# Patient Record
Sex: Female | Born: 1935 | Race: White | Hispanic: No | Smoking: Current every day smoker
Health system: Southern US, Community
[De-identification: ages and names within clinical notes are randomized; demographics above are authoritative.]

## PROBLEM LIST (undated history)

## (undated) DIAGNOSIS — B269 Mumps without complication: Secondary | ICD-10-CM

## (undated) DIAGNOSIS — I10 Essential (primary) hypertension: Secondary | ICD-10-CM

## (undated) DIAGNOSIS — S3210XA Unspecified fracture of sacrum, initial encounter for closed fracture: Secondary | ICD-10-CM

## (undated) DIAGNOSIS — G809 Cerebral palsy, unspecified: Secondary | ICD-10-CM

## (undated) DIAGNOSIS — F17200 Nicotine dependence, unspecified, uncomplicated: Secondary | ICD-10-CM

## (undated) DIAGNOSIS — E079 Disorder of thyroid, unspecified: Secondary | ICD-10-CM

## (undated) DIAGNOSIS — T7840XA Allergy, unspecified, initial encounter: Secondary | ICD-10-CM

## (undated) DIAGNOSIS — R7303 Prediabetes: Secondary | ICD-10-CM

## (undated) DIAGNOSIS — E785 Hyperlipidemia, unspecified: Secondary | ICD-10-CM

## (undated) DIAGNOSIS — M25373 Other instability, unspecified ankle: Secondary | ICD-10-CM

## (undated) DIAGNOSIS — M419 Scoliosis, unspecified: Secondary | ICD-10-CM

## (undated) HISTORY — DX: Nicotine dependence, unspecified, uncomplicated: F17.200

## (undated) HISTORY — DX: Cerebral palsy, unspecified: G80.9

## (undated) HISTORY — DX: Other instability, unspecified ankle: M25.373

## (undated) HISTORY — DX: Scoliosis, unspecified: M41.9

## (undated) HISTORY — DX: Essential (primary) hypertension: I10

## (undated) HISTORY — PX: ABDOMINAL AORTIC ANEURYSM REPAIR: SHX42

## (undated) HISTORY — DX: Allergy, unspecified, initial encounter: T78.40XA

## (undated) HISTORY — DX: Hyperlipidemia, unspecified: E78.5

## (undated) HISTORY — DX: Prediabetes: R73.03

## (undated) HISTORY — DX: Mumps without complication: B26.9

## (undated) HISTORY — DX: Disorder of thyroid, unspecified: E07.9

## (undated) HISTORY — DX: Unspecified fracture of sacrum, initial encounter for closed fracture: S32.10XA

---

## 1998-06-03 ENCOUNTER — Emergency Department (HOSPITAL_COMMUNITY): Admission: EM | Admit: 1998-06-03 | Discharge: 1998-06-03 | Payer: Self-pay | Admitting: Emergency Medicine

## 1999-09-02 ENCOUNTER — Encounter: Payer: Self-pay | Admitting: Internal Medicine

## 1999-09-02 ENCOUNTER — Encounter: Admission: RE | Admit: 1999-09-02 | Discharge: 1999-09-02 | Payer: Self-pay | Admitting: Internal Medicine

## 2001-10-11 ENCOUNTER — Ambulatory Visit (HOSPITAL_BASED_OUTPATIENT_CLINIC_OR_DEPARTMENT_OTHER): Admission: RE | Admit: 2001-10-11 | Discharge: 2001-10-11 | Payer: Self-pay | Admitting: Otolaryngology

## 2001-10-11 ENCOUNTER — Encounter (INDEPENDENT_AMBULATORY_CARE_PROVIDER_SITE_OTHER): Payer: Self-pay | Admitting: Specialist

## 2002-06-07 ENCOUNTER — Encounter: Admission: RE | Admit: 2002-06-07 | Discharge: 2002-06-07 | Payer: Self-pay | Admitting: *Deleted

## 2007-06-09 DIAGNOSIS — S3210XA Unspecified fracture of sacrum, initial encounter for closed fracture: Secondary | ICD-10-CM

## 2007-06-09 HISTORY — PX: ILIAC ARTERY STENT: SHX1786

## 2007-06-09 HISTORY — DX: Unspecified fracture of sacrum, initial encounter for closed fracture: S32.10XA

## 2007-11-04 ENCOUNTER — Emergency Department (HOSPITAL_COMMUNITY): Admission: EM | Admit: 2007-11-04 | Discharge: 2007-11-04 | Payer: Self-pay | Admitting: Emergency Medicine

## 2007-12-14 ENCOUNTER — Encounter: Admission: RE | Admit: 2007-12-14 | Discharge: 2007-12-14 | Payer: Self-pay | Admitting: Orthopedic Surgery

## 2008-02-08 ENCOUNTER — Ambulatory Visit: Payer: Self-pay | Admitting: Vascular Surgery

## 2008-02-15 ENCOUNTER — Encounter: Admission: RE | Admit: 2008-02-15 | Discharge: 2008-02-15 | Payer: Self-pay | Admitting: Vascular Surgery

## 2008-02-22 ENCOUNTER — Ambulatory Visit: Payer: Self-pay | Admitting: Vascular Surgery

## 2008-03-02 ENCOUNTER — Ambulatory Visit: Payer: Self-pay | Admitting: Vascular Surgery

## 2008-03-02 ENCOUNTER — Ambulatory Visit: Payer: Self-pay | Admitting: Cardiology

## 2008-03-06 ENCOUNTER — Inpatient Hospital Stay (HOSPITAL_COMMUNITY): Admission: RE | Admit: 2008-03-06 | Discharge: 2008-03-09 | Payer: Self-pay | Admitting: Vascular Surgery

## 2008-03-06 ENCOUNTER — Encounter: Payer: Self-pay | Admitting: Vascular Surgery

## 2008-03-07 ENCOUNTER — Encounter: Payer: Self-pay | Admitting: Vascular Surgery

## 2008-04-09 ENCOUNTER — Encounter: Admission: RE | Admit: 2008-04-09 | Discharge: 2008-04-09 | Payer: Self-pay | Admitting: Vascular Surgery

## 2008-04-11 ENCOUNTER — Ambulatory Visit: Payer: Self-pay | Admitting: Vascular Surgery

## 2009-07-12 IMAGING — CT CT ANGIO ABDOMEN
2 of 5 series · 16 of 46 positions shown, 18 images · IV contrast ([ID] OMNI 350)
Comparison: 03/08/2008

CTA ABDOMEN

CLINICAL DATA: Stent graft

CT ANGIOGRAPHY  ABDOMEN AND PELVIS
TECHNIQUE: Multidetector CT imaging through the abdomen and pelvis
using the standard protocol during bolus administration of
intravenous contrast. Multiplanar reconstructed images obtained and
reviewed to evaluate the vascular anatomy. 75 ml Omnipaque 350

[Series 5: angio · axial · 0.66mm/px · z∈[-358,-28]mm · 13 of 190 slices shown, 15 images]
[im 9/190  soft-tissue]
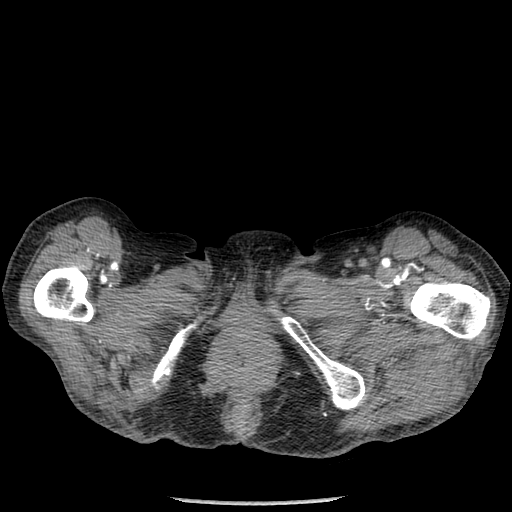
[im 9/190  bone]
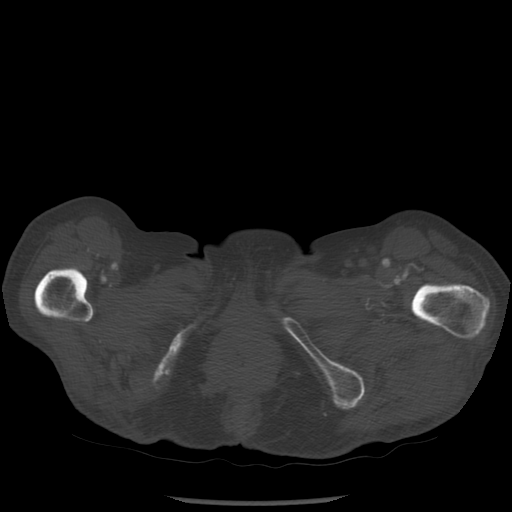
[im 25/190  soft-tissue]
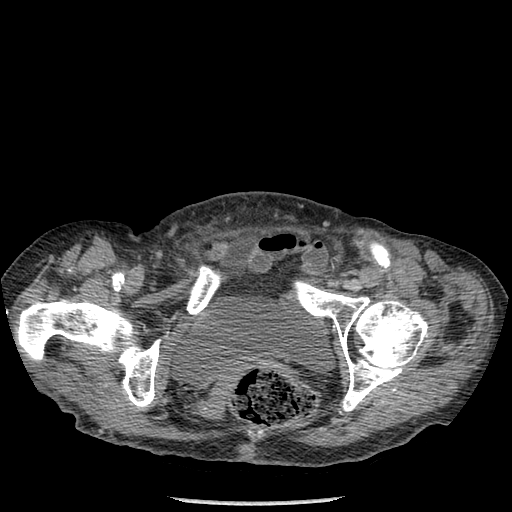
[im 42/190  soft-tissue]
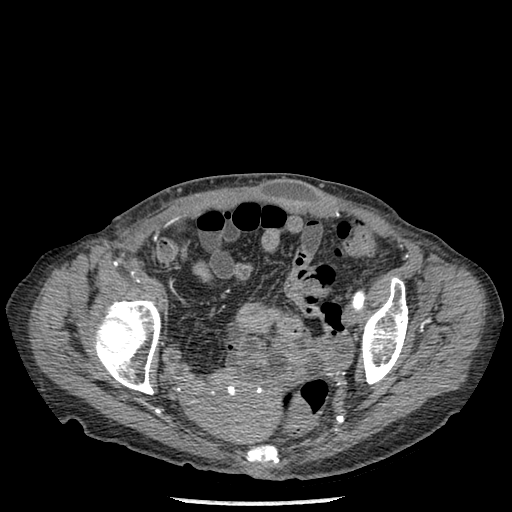
[im 50/190  soft-tissue]
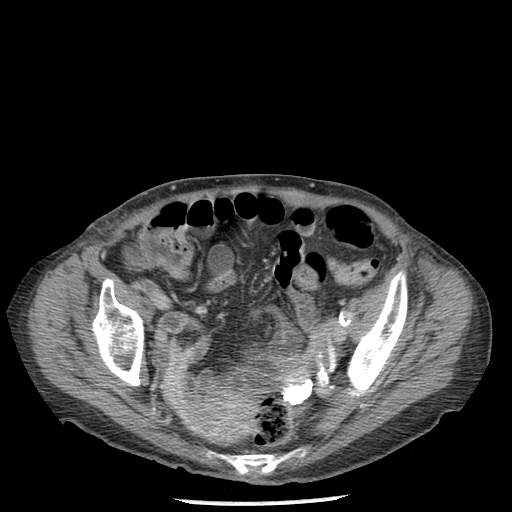
[im 66/190  soft-tissue]
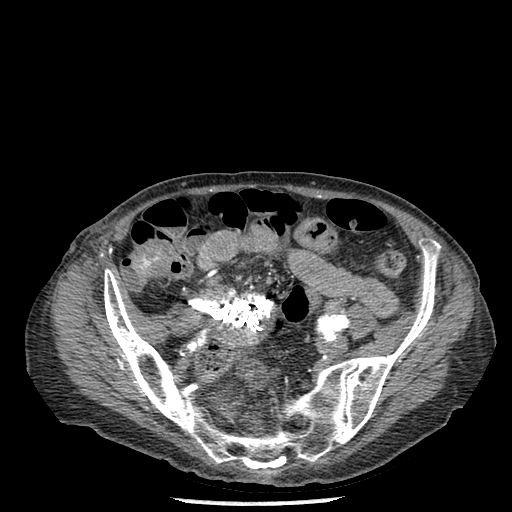
[im 83/190  soft-tissue]
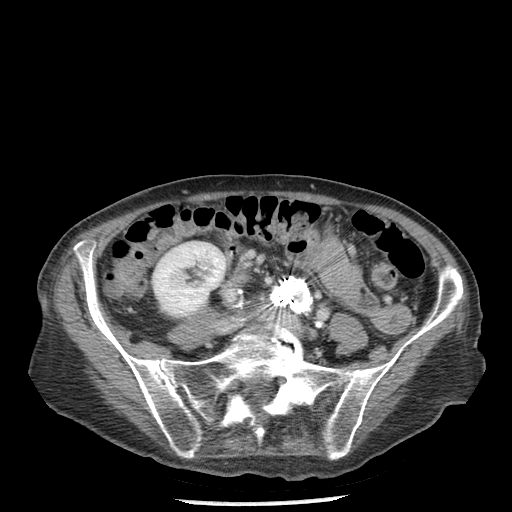
[im 99/190  soft-tissue]
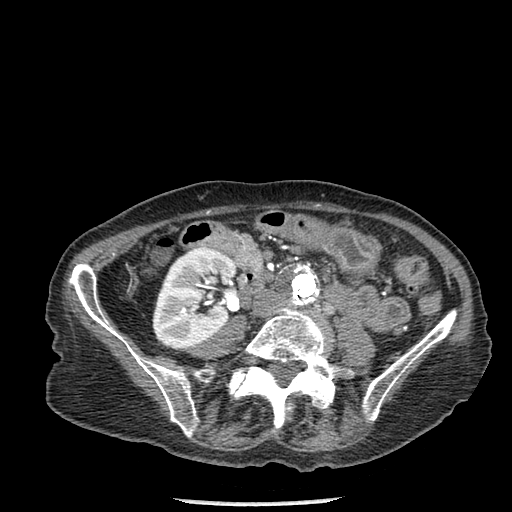
[im 107/190  soft-tissue]
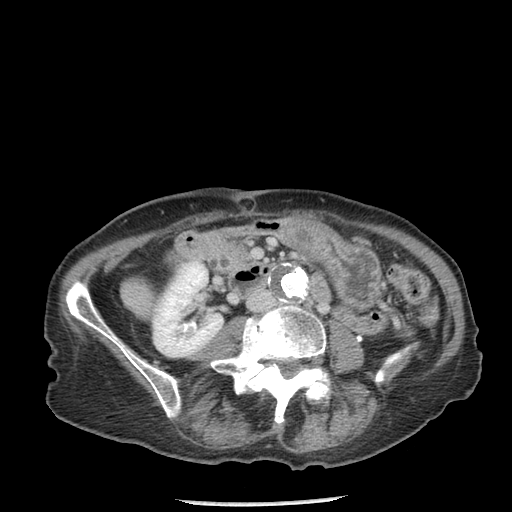
[im 124/190  soft-tissue]
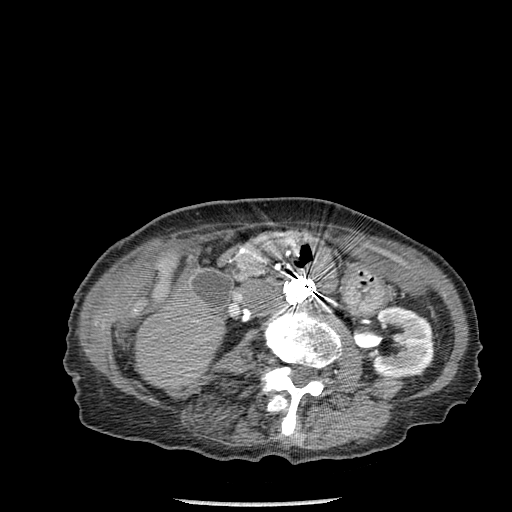
[im 124/190  bone]
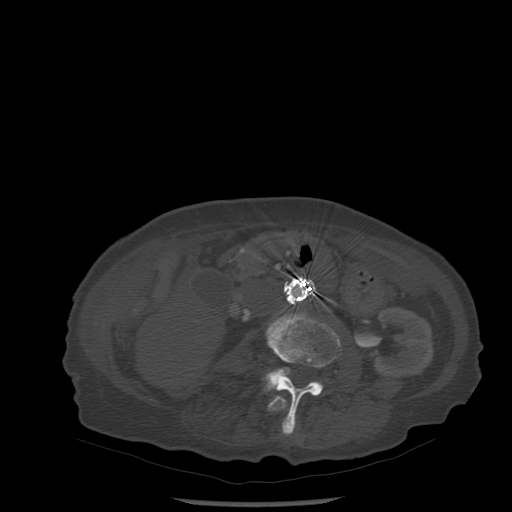
[im 140/190  soft-tissue]
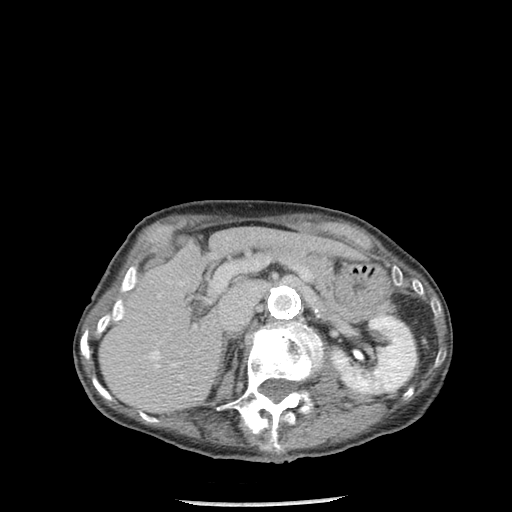
[im 148/190  soft-tissue]
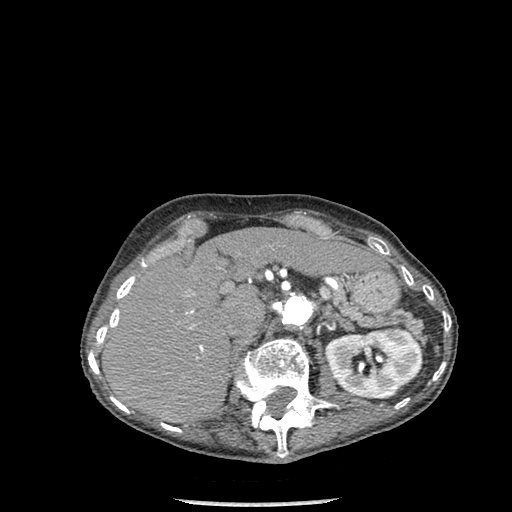
[im 165/190  soft-tissue]
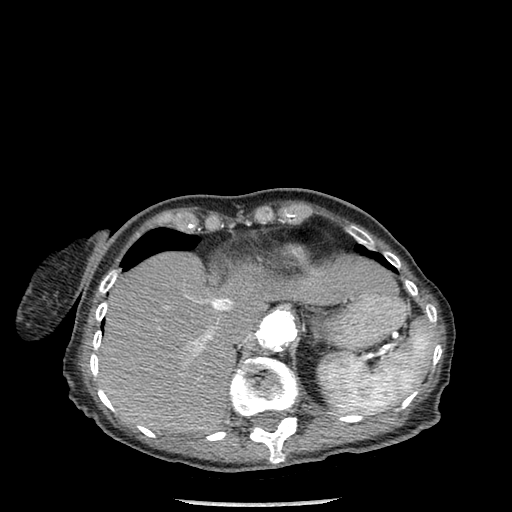
[im 181/190  soft-tissue]
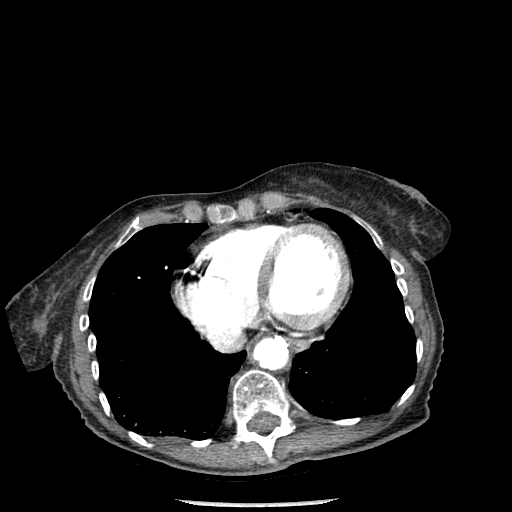

[Series 602: sagittal body · sagittal · 0.73mm/px · 3 of 121 slices shown]
[im 41/121  soft-tissue]
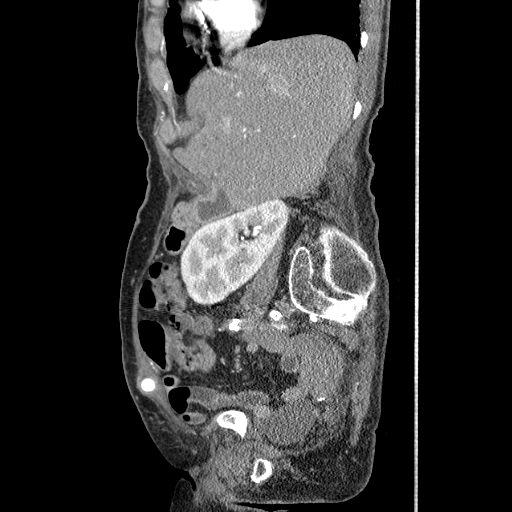
[im 54/121  soft-tissue]
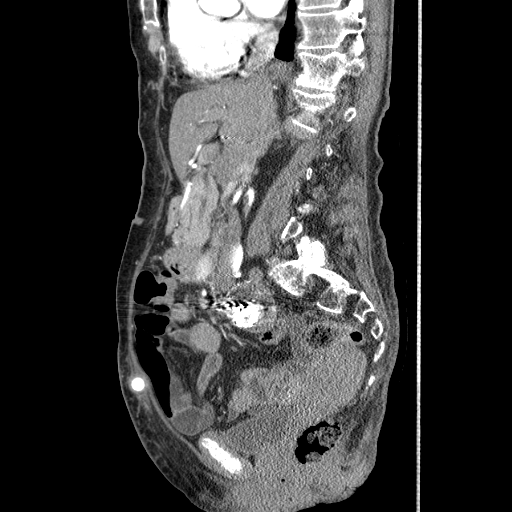
[im 67/121  soft-tissue]
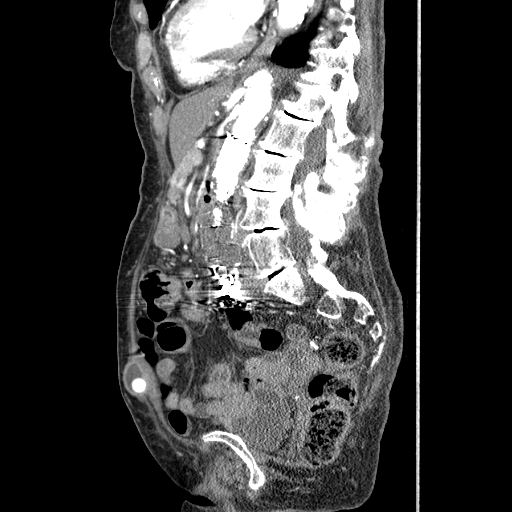

[16 of 46 positions shown; findings below may reference images not displayed]

FINDINGS: The aorta at the left iliac stent graft is stable in
appearance.  There is narrowing in the midportion of the aortic
segment which is stable.  There is no evidence of endoleak.  The
aneurysm sac diameter has decreased from 3.1-2.9 cm.

Bibasilar atelectasis is stable.  The liver, gallbladder, kidneys,
pancreas, and adrenal glands are all stable in appearance. Ventral
midline hernia containing adipose tissue is stable.  No free fluid
or abnormal adenopathy.  L1 compression is stable.
IMPRESSION: The aorta and left iliac stent graft has a stable appearance
without evidence of complication.  There is persistent narrowing in
the midsegment.  No endoleak.  Sac diameter has decreased from 3.1-
2.9 cm in diameter.

CTA PELVIS
FINDINGS: The left iliac segment of the stent graft remains patent.
There is irregularity in the distal portion of the iliac stent
graft as described on the previous study.  There is no significant
flow-limiting stenosis.  The landing zone is just proximal to the
bifurcation of the left internal and external iliac arteries.
These vessels are ectatic and patent.  The left femoral to right
femoral bypass graft is widely patent.  There is persistent
occlusion of the right internal, external, and common iliac
arteries via coil embolization.  No evidence of contrast
opacification of the right common iliac aneurysm sac.  The sac
diameter has decreased from 4.0 cm to 3.8 cm.

Negative free fluid or abnormal adenopathy.  Uterus and adnexa are
unremarkable.  Degenerative changes in the spine are stable.

There is a small amount of fluid about the femoral to femoral
bypass graft.  There is no air to suggest abscess.  It is most
noticeable in the left suprapubic region and left groin.
IMPRESSION: Stable irregularity of the distal aspect of the left iliac stent
graft.  There is no flow-limiting stenosis.

Stable occlusion of the right common, internal, and and external
iliac arteries.  Right common iliac aneurysm sac has decreased in
size to 3.8 cm in diameter.

There is now a small amount of fluid around the femoral to femoral
bypass graft which is nonspecific.  The femoral-femoral bypass
graft is widely patent.

## 2010-05-13 ENCOUNTER — Encounter (HOSPITAL_COMMUNITY): Admission: RE | Admit: 2010-05-13 | Payer: Self-pay | Admitting: Internal Medicine

## 2010-06-10 ENCOUNTER — Encounter (HOSPITAL_COMMUNITY)
Admission: RE | Admit: 2010-06-10 | Discharge: 2010-07-08 | Payer: Self-pay | Source: Home / Self Care | Attending: Internal Medicine | Admitting: Internal Medicine

## 2010-06-27 ENCOUNTER — Other Ambulatory Visit (HOSPITAL_COMMUNITY): Payer: Self-pay | Admitting: Internal Medicine

## 2010-06-27 DIAGNOSIS — E0520 Thyrotoxicosis with toxic multinodular goiter without thyrotoxic crisis or storm: Secondary | ICD-10-CM

## 2010-06-29 ENCOUNTER — Encounter: Payer: Self-pay | Admitting: Internal Medicine

## 2010-08-07 ENCOUNTER — Ambulatory Visit (HOSPITAL_COMMUNITY)
Admission: RE | Admit: 2010-08-07 | Discharge: 2010-08-07 | Disposition: A | Discharge status: Home / Self Care | Payer: Medicare Other | Source: Ambulatory Visit | Attending: Internal Medicine | Admitting: Internal Medicine

## 2010-08-07 DIAGNOSIS — E0520 Thyrotoxicosis with toxic multinodular goiter without thyrotoxic crisis or storm: Secondary | ICD-10-CM | POA: Insufficient documentation

## 2010-08-07 MED ORDER — SODIUM IODIDE I 131 CAPSULE
25.0000 | Freq: Once | INTRAVENOUS | Status: AC | PRN
  Administered 2010-08-07: 25.3 via ORAL

## 2010-10-21 NOTE — Consult Note (Signed)
NAME:  Katrina Meadows, Katrina Meadows              ACCOUNT NO.:  [id number]   MEDICAL RECORD NO.:  [id number]          PATIENT TYPE:  AMB   LOCATION:  SDS                          FACILITY:  [facility]   PHYSICIAN:  Thomas C. Wall, MD, FACCDATE OF BIRTH:  26-Aug-1928   DATE OF CONSULTATION:  03/02/2008  DATE OF DISCHARGE:  03/02/2008                                 CONSULTATION   We were asked by Dr. Fabienne Bruns to consult on Katrina Meadows for  preoperative clearance for correction of a right internal iliac aneurysm  and a tight left common iliac artery stenosis.   HISTORY OF PRESENT ILLNESS:  She is a delightful 77 year old white  female who is basically suffered a number of years with cerebral palsy.  She is limited to walking on a walker, but sitting in a chair mostly  watching television.  She says you don't know how boring this is.   She has never had any cardiac disease per se.  Her activity level is  such that we cannot really say if she is having any exertional angina or  not.  She has never had any cardiac testing.   This large right internal iliac aneurysm was picked up when she had an  MRI for scoliosis with Dr. Simonne Come.   She has no right groin pain.  She has had no claudication that I can  elicit.   She came in for angiogram today, which showed the above findings.  The  plan is for surgery next week with Dr. Darrick Penna.  I am not sure the detail  of the operation, though it sounds like it will involve general  anesthesia and an incision.   PAST MEDICAL HISTORY:  She smokes 2 packs of cigarettes a day and has  for a number of years.  She has a history of hypertension.   ALLERGIES:  She has an allergy to SULFA DRUGS.  She also does not  tolerate CODEINE.  NICKEL apparently causes a rash.  MIRTAZAPINE has  caused her tongue to swell.   MEDICATIONS:  1. Hydrochlorothiazide 12.5 mg a day.  2. Atenolol 50 mg a day.  3. Tylenol Arthritis p.r.n.  4. Tylenol Extra Strength p.r.n.  5.  Vitamin C 5000 mg per day.  6. Vitamin D 4000 units 2 a day.   FAMILY HISTORY:  Unremarkable.   SOCIAL HISTORY:  She is divorced and has 2 children.  She lives with her  daughter and granddaughter.  She does not consume alcohol on a regular  basis.   REVIEW OF SYSTEMS:  She has had some weight loss recently.  She has had  some mild dyspnea on exertion.  She has chronic productive cough from  her COPD.  She has had some reflux symptoms.  She never had any sort of  vascular event including a TIA or stroke or DVT.  She is plagued by  scoliosis and has a lot of pain on her right side when she lays on her  right side.   Rest of review of systems were negative.   PHYSICAL EXAMINATION:  VITAL SIGNS:  Blood  pressure is currently 130/75,  her pulse is 65 and regular, respirations 18 and unlabored.  GENERAL:  She is very frail.  Very pleasant, alert, and oriented x3.  Affect is normal.  She has really good sense of humor.  SKIN:  Pale and dry.  HEENT:  Unremarkable except for her dentition which is in poor status.  NECK:  Carotid upstrokes were equal bilaterally without bruits.  There  is no JVD.  Thyroid is not enlarged.  LUNGS:  Clear to auscultation and percussion anteriorly.  HEART:  Accentuated S1.  No significant murmur, no gallop, S2 splits.  ABDOMEN:  Soft, good bowel sounds.  I did not press heavily because of  her procedure today.  EXTREMITIES:  Her femoral pulses are present, but reduced.  Distal  pulses were palpable, but reduced.  She has no edema.  No sign of DVT.  NEUROLOGIC:  She has severe scoliosis.  She is limited with her mobility  but grossly intact.  She has cerebral palsy.   EKG shows sinus rhythm with some artifact, but no acute changes.   ASSESSMENT:  1. Subclinical coronary artery disease.  2. Severe peripheral vascular disease with a high-grade left and      common iliac artery stenosis and a right internal iliac aneurysm.  3. Heavy tobacco use.  4. Chronic  obstructive pulmonary disease.  5. Severe scoliosis.  6. Cerebral palsy.   This is an unfortunate lady who is very nice and also very realistic  about her condition.  I have quoted her about a 10% or may be slightly  less risk of having vascular complications including cerebrovascular or  cardiac complications with surgery.  If Dr. Darrick Penna is planning a  noninvasive procedure such as stenting or exclusion or embolization,  then perhaps it would be a lot less.  I am not sure of his plans.  If  she has general anesthesia and major incisions, her risk is certainly in  the moderate range.   I do not think clinical cardiac testing will be helpful and is really  not necessary for preop clearance.  She is realistic.  She accepts the  risks and wants to avoid pain and rupture.   Thank you very much for allowing me to evaluate this nice patient.  She  may need pulmonary consultation after surgery to get her off the vent  and keep her off vent assistance.  We will be available for consultation  as well.  Certainly, do not interrupt her beta-blocker but use  intravenous beta-blockers if she is not taking p.o., such as metoprolol  5-7.5 mg every 4 hours for resting heart rate of 60-70 in maintenance of  good blood pressure.      Thomas C. Daleen Squibb, MD, [facility]  Electronically Signed     TCW/MEDQ  D:  03/02/2008  T:  03/03/2008  Job:  [id number]   cc:   Janetta Hora. Darrick Penna, MD

## 2010-10-21 NOTE — Op Note (Signed)
NAMEArlicia, Paquette Ava              ACCOUNT NO.:  [id number]   MEDICAL RECORD NO.:  [id number]          PATIENT TYPE:  INP   LOCATION:  3302                         FACILITY:  [facility]   PHYSICIAN:  Janetta Hora. Fields, MD  DATE OF BIRTH:  Aug 06, 1928   DATE OF PROCEDURE:  DATE OF DISCHARGE:                               OPERATIVE REPORT   PROCEDURES:  1. Placement of Gore Excluder stent graft abdominal aorta.  2. Coil embolization of right common iliac, right external iliac, and      right internal iliac artery.  3. Right common iliac artery stenting.  4. Stenting of left common iliac artery.  5. Angioplasty of left common iliac artery for placement of stent      graft device.  6. Left-to-right femoral-femoral bypass with 8-mm Dacron.  7. Right common femoral endarterectomy.   PREOPERATIVE DIAGNOSIS:  Right common iliac artery aneurysm.   POSTOPERATIVE DIAGNOSIS:  Right common iliac artery aneurysm.   ANESTHESIA:  General.   ASSISTANTS:  1. Larina Earthly, MD  2. Wilmon Arms, PA-C   OPERATIVE FINDINGS:  1. 23 x 14 x 12 main body of left side.  2. 23 x 14 x 12 overlapping main body of left side.  3. 16 x 9.5 left iliac limb.  4. 14 x 40 left common iliac artery SMART stent.  5. An 8-mm Dacron femoral-femoral bypass.   SPECIMENS:  Right common femoral artery plaque.   OPERATIVE DETAILS:  After obtaining informed consent, the patient was  taken to the operating room.  The patient was placed in supine position  on the operating table.  After induction of general anesthesia and  endotracheal intubation, the patient was prepped from the xiphoid down  to the mid knees.  Next, bilateral oblique groin incisions were made to  expose the right and left common femoral arteries.  These were dissected  free circumferentially and vessel loops placed around circumflex iliac  branches as well as the proximal and distal common femoral artery.  The  right common femoral artery was heavily  calcified.  There was a soft  segment on the anterior one-third.  The left common iliac artery did  have some posterior plaque, left common femoral artery did have some  posterior plaque, but the anterior two-thirds was fairly soft in  character.   Next, a Majestic needle was used to cannulate the right common femoral  artery and a long 8-French sheath introduced into the right iliac  system.  This was thoroughly flushed with heparinized saline.  A  retrograde right common femoral arteriogram was obtained to determine  the location of the iliac bifurcation.  Next, a 0.035 Bentson wire was  threaded up into the right common iliac artery system.  A 5-French 45  angled catheter was brought up in the operative field and this was used  to selectively catheterize the right internal iliac artery.  Several  tornado and Nestor coils were then placed into the right internal iliac  artery system.  An additional contrast angiogram was performed and  showed that there was fairly good occlusion of  the right internal iliac  artery, main branches, and trunk.  At this point, a Kumpe catheter was  brought up in the operative field and this was exchanged over the  Bentson wire.  Attempts were then made to cross the right common iliac  artery origin.  This had a preoperative high-grade stenosis that was  known.  Multiple attempts were made to across this stenosis with a Kumpe  catheter using an angled Glidewire and an 0.014 wire.  However, these  were unsuccessful.  At this point, Majestic needle was used to cannulate  the left common femoral artery and a 0.035 Bentson wire was threaded up  into the abdominal aorta under fluoroscopic guidance.  A long 8-French  sheath was then placed over the guidewire in the left iliac system.  Using a crossover catheter, I was able to selectively catheterize the  right common iliac artery.  A 0.035 Glidewire was then passed across the  stenosis of the proximal right  common iliac artery and down into the  right external iliac artery system.  A snare was then introduced through  the right common femoral system and this was used to snare the distal  end of the Glidewire.  This was then pulled out through the snare and  through the sheath.  The Glidewire was then pulled down into the right  iliac system until a Kumpe catheter could be placed over the Glidewire  in a reverse fashion up through the right iliac system.  This Kumpe  catheter was then used to cross the right common iliac artery stenosis  and a 0.035 Amplatz wire was then placed through the Kumpe catheter up  into the abdominal aorta.  Next, attention was turned back to the left  femoral system.  The preoperative measurements indicated that a 23 x 14  x 12 main body device would be best suited for the patient.  This was  brought up on the operative field.  Next, an 18-French sheath was  brought onto the left femoral side.  A Kumpe catheter was used to  exchange the Bentson wire on the left side for a 0.035 Amplatz wire and  this was threaded up into the distal thoracic aorta.  Next, an 18-French  sheath was placed over the guidewire into the left femoral system.  However, the sheath would not pass up into the left iliac system as  there was calcified plaque near the iliac bifurcation and at the aortic  bifurcation.  Therefore, the 18-French sheath was pulled back and a 7-mm  angioplasty balloon was brought up on the operative field.  The dilator  from the 18-French sheath was removed and the 7-mm balloon placed  through the 18-French sheath into the left iliac system.  The entire  left iliac system from the aortic bifurcation all the way down to the  iliac, bifurcation was then ballooned with 7-mm balloon using hand  insufflation.  An attempt was again made to pass the 18-French sheath  and again this was sticking in the same location near the iliac  bifurcation.  The dilator was again pulled  back from the 18-French  sheath and then over the guidewire.  The 7-mm balloon was brought back  up on the operative field and then this was inflated with an insufflator  to 10 atmospheres for 1 minute throughout the entire course of the left  iliac system.  This required four inflations of the balloon.  An attempt  was again made to  advance the 18-French sheath, but it still would not  go.  Therefore, the 18-French sheath was removed and a 16-French sheath  was placed over the guidewire into the left iliac system.  This was then  advanced with minimal difficulty up into the abdominal aorta.  The  dilator was then removed.  The main body Gore device, a 23 x 14 x 12  device was then placed through the 16-French sheath up into the  abdominal aorta.  A 5-French Omni flush catheter was then placed over  the Amplatz wire going through the right iliac system and the guidewire  was removed and contrast angiogram was obtained to determine precise  location of the left and right renal arteries.  The main body device was  then deployed just below the level of the renal arteries.  At this  point, a 16 x 9.5 Gore Excluder limb was brought up on the operative  field.  This was then placed through the sheath and over the guidewire  to dock into the main body of the graft.  Retrograde contrast angiogram  was obtained through the sheath to determine precise location of the  left internal iliac artery.  The left limb was then deployed with its  tip all the way down to the iliac bifurcation, but not covering the  hypogastric artery.  A coated balloon was then brought up in the  operative field and everything was ballooned from the proximal  attachment site to the mid iliac limb attachment site and the distal  iliac attachment site and the common iliac artery.  The patient had been  given 5000 units of heparin at the beginning of the procedure and she  required several re-boluses of 5000 units of heparin  during the course  of the procedure.  At this point, a 5-French Kumpe catheter was then  introduced over the Amplatz wire on the right side after removing the  Omni flush catheter.  The common catheter had been placed through a long  8-French sheath.  This Kumpe catheter was then adjusted, so that its tip  was adjacent to the gate of the main body.  Several attempts were then  made with a Glidewire and with an 0.014 wire.  With the Kumpe catheter,  I was try to cannulate the gate.  However, it seemed that the gate have  become compressed in a narrow segment of the aorta and had not opened  completely.  After several attempts of trying to cannulate the gate  unsuccessfully, it was decided to try to cannulate the gate from the  contralateral side.  First, an Omni flush catheter was brought up and  over from the left iliac system.  Attempts were made with an angled  Glidewire to catheterize the left common iliac limb, but these were not  successful.  An 0.014 wire was then tried; however, this kept coming up  the side holes of the Omni flush catheter.  Therefore, the Omni flush  catheter was pulled back and a 5-French SOS Omni catheter placed over  the Guidewire up to the gate bifurcation.  Again, several attempts were  made with the angled Glidewire and the 0.014 wire to try to catheterize  the left common iliac system, but again these were all unsuccessful.  It  was decided at this point that the gate had been too compressed to  cannulate successfully.  It was decided at this point to convert the  patient to an aorto-unifem and embolize  the iliac artery with a femoral-  femoral bypass.  At this point, the Kumpe catheter was pulled down into  the right iliac system.  Multiple large Nestor coils were placed from  the right common iliac bifurcation all the way down from the origin of  the right common iliac artery all the way down to the iliac bifurcation.  These coils were then extended into  the right internal iliac artery and  down into the external iliac artery to totally exclude flow from the  right common iliac artery aneurysm.  Next, to completely exclude the  open limb of the previous main body, an additional 23 x 14 x 12 main  body was brought up on the operative field and this was placed through  the 16-French sheath over the Amplatz wire, so that it was fit flush  with the previous main body device.  This was also rotated 180 degrees,  so that its gate was completely rotated away from the previous gate.  The device was then deployed throughout its full length.  This was then  ballooned at the top and bifurcation ends with a coated balloon.  Contrast angiogram was then obtained, which showed that the left and  right renal arteries were patent.  There was good flow through the left  common iliac system and into the left internal and external iliac  arteries.  There was no flow through the right iliac system.  At this  point for extra reinforcement at the pinched segment of the main body  graft, a 14 x 40 SMART stent was brought up in the operative field and  this was placed over the Amplatz wire into the narrowed segment of the  main body graft near the calcified portion of the aorta.  This SMART  stent was then deployed in the usual fashion.  The SMART stent device  was then removed and the coated balloon again brought up on the  operative field and this was placed in the mid section of the stent and  reinflated to mold the SMART stent.  The coated balloon was then  removed.  At this point, the sheaths and guidewires were removed from  the right common femoral system.  There was no antegrade flow at this  point.  The distal right common femoral artery and circumflex iliac  branches were controlled with vessel loops.  On the left side, all  guidewires and catheters were removed over wires.  The left common  femoral artery was controlled with a Henley clamp and distally  with a  peripheral DeBakey clamp.  A subcutaneous tunnel was created over the  pubic bone connecting the left groin to the right groin incision and 8-  mm Dacron graft was then brought through the subcutaneous tunnel.  The  left limb of the femoral-femoral bypass was then spatulated and sewn end  of graft to side of left common femoral artery using a running 5-0  Prolene suture.  Just prior to completion of the anastomosis, everything  was forebled, backbled, and thoroughly flushed.  The graft was then  clamped just above its origin.  Attention was then turned to the right  common femoral artery.  This was heavily calcified.  This required a  right common femoral endarterectomy to make a suturable surface for  grafting.  The graft on the right side was then beveled.  This was then  sewn end of graft to side of right common femoral artery using a running  5-0 Prolene suture.  Just prior to completion of the anastomosis, this  was forebled, backbled, and thoroughly flushed.  Anastomosis was  secured.  Clamps were released.  There was good pulsatile flow in the  right common femoral artery immediately.  Feet were inspected and found  to be pink and warm with dorsalis pedis and posterior tibial Doppler  signals bilaterally.  Next, hemostasis was obtained with 80 mg of  protamine and direct pressure.  Both groins were then closed in multiple  layers using running 2-0, 3-0 Vicryl sutures, and a 4-0 Vicryl  subcuticular stitch in the skin.  The patient tolerate procedure well  and there were no complications.  Instrument, sponge, and needle counts  were correct at the end of the case.  The patient was taken to recovery  room in stable condition.     Janetta Hora. Fields, MD  Electronically Signed    CEF/MEDQ  D:  03/06/2008  T:  03/07/2008  Job:  [id number]

## 2010-10-21 NOTE — Op Note (Signed)
NAME:  Katrina Meadows, Katrina Meadows              ACCOUNT NO.:  [id number]   MEDICAL RECORD NO.:  [id number]          PATIENT TYPE:  AMB   LOCATION:  SDS                          FACILITY:  [facility]   PHYSICIAN:  Charles E. Fields, MD  DATE OF BIRTH:  1928-12-02   DATE OF PROCEDURE:  03/02/2008  DATE OF DISCHARGE:  03/02/2008                               OPERATIVE REPORT   PROCEDURE:  Aortogram, bilateral lower extremity runoff.   PREOPERATIVE DIAGNOSIS:  Right common iliac artery aneurysm.   POSTOPERATIVE DIAGNOSIS:  Right common iliac artery aneurysm.   ANESTHESIA:  Local.   INDICATIONS:  The patient has a large right common iliac artery  aneurysm.  She presents today for consideration of coiling of a right  internal iliac artery in preparation for endovascular stent grafting.   OPERATIVE DETAIL:  After obtaining informed consent, the patient was  taken to the [facility] lab.  The patient was placed in supine position on the  angio table.  Local anesthesia was infiltrated over the left common  femoral artery.  Majestic needle was used to cannulate the left common  femoral artery and a 0.035 Wholey wire threaded up into the abdominal  aorta under fluoroscopic guidance.  Next, a 6-French sheath was placed  over the guidewire into the left common femoral artery.  A 5-French  pigtail catheter was then placed over the guidewire into the abdominal  aorta.  Abdominal aortogram was then obtained.  This shows a tortuous  and moderately atherosclerotic infrarenal abdominal aorta.  There are  single renal arteries bilaterally and these were both patent.  There is  an 80% stenosis of the origin of the right common iliac artery.  This is  best visualized on an oblique view.  The left common iliac artery is  patent.  The left internal iliac artery and external iliac artery is  patent.  Several oblique views were then performed to further define the  origin of the right internal iliac artery.  This was best viewed  on  image series #5.  The right internal iliac artery is patent and heavily  calcified at its origin.  Also visualized on this view, the right common  iliac artery is heavily calcified.  There again is approximately 80%  stenosis of the right common iliac artery at its origin.  The right  external iliac artery is widely patent.   Due to the high-grade right common iliac artery stenosis and the oblique  takeoff of the internal iliac artery, it was thought that this would be  technically difficult to attempt the hypogastric artery embolization and  that the patient also may not be a candidate for stent graft repair  secondary to the heavily diseased iliac arteries.  Therefore, at this  point, a lower extremity runoff was obtained again for diagnostic  purposes.  The internal iliac artery embolization procedure was not  attempted.  The left and right common femoral and profunda femoris  arteries are widely patent.  The superficial femoral artery is diffusely  diseased bilaterally, but patent.  Popliteal arteries are patent  bilaterally.  There is diminutive  peroneal arteries on both sides.  The  posterior tibial artery is the dominant runoff vessel to the foot  bilaterally.  The anterior tibial arteries occlude bilaterally.   The patient tolerated the procedure well and there were no  complications.  The patient was taken to the holding area in stable  condition with the sheath in place.   OPERATIVE FINDINGS:  1. High-grade stenosis of right common iliac artery.  2. Calcified right internal iliac artery and common iliac artery.  3. Bilateral single vessel runoff via the posterior tibial artery to      both feet with diffuse superficial femoral artery occlusive      disease.      Janetta Hora. Fields, MD  Electronically Signed     CEF/MEDQ  D:  03/02/2008  T:  03/03/2008  Job:  [id number]

## 2010-10-21 NOTE — Assessment & Plan Note (Signed)
OFFICE VISIT   Katrina Meadows, Katrina Meadows  DOB:  Jan 27, 1929                                       02/22/2008  CHART#:[id number]   The patient returns for followup today after the CT scan of the abdomen  and pelvis for evaluation for possible stent graft to an iliac artery  aneurysm.  She last seen on September 2.  She continues to be free of  abdominal or back pain.  She states she does have some occasional  urinary retention which has been present for several years.   PHYSICAL EXAM:  Unchanged today.  She does have a pulsatile mass in the  abdomen.  This is nontender.  She has 2+ femoral pulses.   CT scan of the aneurysm is reviewed and this shows a 4.4 cm right common  iliac artery aneurysm.  The abdominal aorta is ectatic at its distal  segment but only 3.1 cm in diameter.  She does have some narrowing of  the right external iliac artery near the left iliac bifurcation.  In  overall measurement it does seem that this would be acceptable of the  device diameter of the Gore Excluder graft.   I believe the best option for her would be coil embolization of the  right internal iliac artery with a Gore Excluder stent graft extending  into the right external iliac artery and into the left common iliac  artery.  Due to the small size of the external iliac artery on the right  side, probably main body should be delivered through the left side.  This may require 2 extensions, one on the right side and one on the left  side.  I discussed with the patient today the risks, benefits and  possible complications of fixing her aneurysm with a stent graft.  Also  discussed with her the chance that we may not be able to get the device  to the iliac artery.  If that was the case, then we would convert this  to an open repair.  We have scheduled her to have the right internal  iliac artery coil embolized in the PE lab on Friday September 25.  We  will plan to place her stent graft  the following Tuesday, September 29.  Also, while she is in the hospital, she will have a cardiology  evaluation by [facility].  I spoke with them on the phone today.  Dr. Daleen Squibb  is apparently doing consults in the hospital that day and will contact  either he or Trish regarding this.   Janetta Hora. Fields, MD  Electronically Signed   CEF/MEDQ  D:  02/22/2008  T:  02/23/2008  Job:  [id number]   cc:   Marlowe Kays, M.D.  Lovenia Kim, D.O.  [facility]

## 2010-10-21 NOTE — Assessment & Plan Note (Signed)
OFFICE VISIT   Lombard, Elmire  DOB:  March 27, 1929                                       04/11/2008  CHART#:[id number]   The patient returns for followup today.  She underwent successful  placement of a Gore Excluder stent graft on March 06, 2008.  She  required a left to right femoral-femoral bypass in addition to her stent  grafting.  She had an uneventful hospital stay.  She has been doing well  since then.  She returns today for further followup.   PHYSICAL EXAM:  Today blood pressure 147/68 in the left arm, pulse is 71  and regular.  Both groin incisions are well-healed.  There is some  bogginess to the left groin consistent with a seroma.  There is 2+ graft  pulse in the femoral-femoral bypass.  Feet are pink, warm and well  perfused.  There is no pulsatile abdominal mass.   I reviewed her CT angiogram of the abdomen and pelvis which was  performed on November 2.  This shows good exclusion of the right common  iliac artery aneurysm.  There is good flow through the stent graft.  There is a small area at the takeoff of the external iliac artery just  distal to the stent which may be a small area of intimal dissection.  However, she has good flow on physical exam to the left groin and to the  fem-fem.  I do not believe this is flow limiting.  Otherwise the CT scan  shows the top of the stent at the origin of the left renal.  There is  also a small seroma in the left groin.  The fluid around the graft in  the left groin does not extend across the midline around the graft.   In summary, the patient has recovered well from her aneurysm stent  grafting and left to right femoral-femoral bypass.  She has a small  seroma in the left groin which I believe we can manage conservatively  with observation.  She has a small area dissection in her left external  iliac artery which is not flow limiting, I believe also can be managed  with observation alone.  She  will return for followup in 3 months' time  with repeat CT angiogram at that time.   Janetta Hora. Fields, MD  Electronically Signed   CEF/MEDQ  D:  04/11/2008  T:  04/12/2008  Job:  [id number]   cc:   Lovenia Kim, D.O.  Marlowe Kays, M.D.

## 2010-10-21 NOTE — Discharge Summary (Signed)
Katrina Meadows, Katrina Meadows              ACCOUNT NO.:  [id number]   MEDICAL RECORD NO.:  [id number]          PATIENT TYPE:  INP   LOCATION:  2006                         FACILITY:  [facility]   PHYSICIAN:  Janetta Hora. Fields, MD  DATE OF BIRTH:  1928/07/31   DATE OF ADMISSION:  03/06/2008  DATE OF DISCHARGE:                               DISCHARGE SUMMARY   DISCHARGE DIAGNOSES:  1. Aortic and iliac aneurysms.  2. Peripheral vascular disease.  3. Dyslipidemia.  4. Hypertension.   PROCEDURES PERFORMED:  1. Stent graft of aortic aneurysm with coiling of right common iliac      artery, right external iliac artery, and right internal iliac      arteries.  2. Stenting of left common iliac artery.  3. Left-to-right femoral-femoral bypass grafting by Dr. Darrick Penna on      March 06, 2008.   COMPLICATIONS:  None.   CONDITION AT DISCHARGE:  Stable improving.   DISCHARGE MEDICATIONS:  She is instructed to resume all preoperative  medications consisting of:  1. Hydrochlorothiazide 12.5 mg p.o. daily.  2. Atenolol 50 mg p.o. daily.  3. Tylenol Arthritis p.r.n.  4. Tylenol Extra Strength p.r.n.  5. Vitamin C 5000 mg p.o. daily.  6. Vitamin D 4000 International Units p.o. daily.  7. She is given a prescription for Percocet 5/325 one p.o. q.4 h.      p.r.n. pain, total #20 were given.   DISPOSITION:  She is being discharged home in stable condition with her  wounds healing well.  She is given careful instructions regarding the  care of her wounds.  She is given further instructions regarding her  activities.  She is to see Dr. Darrick Penna in 4 weeks with ABIs and CTA of  the abdomen and pelvis with a stent graft protocol.   BRIEF IDENTIFYING STATEMENT:  For complete details, please refer to the  typed history and physical.  Briefly, this very pleasant 77 year old  woman was referred to Dr. Darrick Penna with an aortic and right iliac  aneurysm.  Dr. Darrick Penna felt that she was a good candidate for  endovascular repair.  She was informed of the risks and benefits of the  procedure, and after careful consideration, elected to proceed with  surgery.   HOSPITAL COURSE:  Preoperative workup was completed as an outpatient.   She was brought in through same-day surgery and underwent the  aforementioned repair.  For complete details, please refer to the typed  operative report.  The procedure was without complication.  She was  returned to the postanesthesia care unit, extubated.  Following  stabilization, she was transferred to a bed on a Surgical Stepdown Unit.  She was observed overnight.  She remained stable; however, due to her  age and slow progression, we elected to keep her in the hospital for  several more days for physical therapy and rehabilitation.   She responded well to the physical therapy and rehab.  She was walking  and improving with physical therapy.  Her wounds were healing well.  She  was felt stable and was discharged home in stable condition on  March 09, 2008.      Wilmon Arms, PA      Janetta Hora. Fields, MD  Electronically Signed    KEL/MEDQ  D:  03/09/2008  T:  03/09/2008  Job:  [id number]

## 2010-10-21 NOTE — Assessment & Plan Note (Signed)
OFFICE VISIT   Meadows, Katrina  DOB:  September 19, 1928                                       02/08/2008  CHART#:[id number]   The patient is a 77 year old female referred for evaluation of iliac  artery aneurysm.  She was being evaluated for scoliosis by an MRI.  She  was noted to have a large iliac aneurysm on this MRI.  She denies new  back pain and this is chronic in nature.  She denies any abdominal pain.  She is fairly debilitated due to cerebral palsy and severe scoliosis.  However, she is able to walk some with the assistance of a walker and  has become less coordinated over time.  She has frequent falls while  walking with her walker.   ATHEROSCLEROTIC RISK FACTORS:  Include hypertension and smoking.  She  currently smokes 2 packs a day.  She denies history of diabetes,  coronary artery disease, or elevated cholesterol.   PAST MEDICAL HISTORY:  Otherwise remarkable for her scoliosis, which is  fairly severe in nature, as well as cerebral palsy.   PAST SURGICAL HISTORY:  She had a spine cyst removed.  She has had a  left rotator cuff repair, and removal of cataracts.   MEDICATIONS:  Hydrochlorothiazide 12.5 mg once a day.  Atenolol 50 mg  once a day.  Tylenol Arthritis p.r.n.  Tylenol Extra Strength p.r.n.  Vitamin C 5000 mg once a day.  Vitamin D 4000 IU 2 daily.   ALLERGIES:  Sulfa drugs, which cause hives.  Codeine, which gives her a  floating feeling.  Nickel, which gives her a rash.  Mirtazapine, which  causes her tongue to swell.   FAMILY HISTORY:  Unremarkable.   SOCIAL HISTORY:  She is divorced and has 2 children.  Smoking history is  as above.  She does not consume alcohol regularly.   REVIEW OF SYSTEMS:  GENERAL:  She has had some weight loss recently.  CARDIAC:  She has dyspnea on exertion.  PULMONARY:  She has a chronic productive cough.  GASTROINTESTINAL:  She has occasional reflux.  RENAL:  She has some urinary frequency every 2 to 3  hours.  VASCULAR:  She denies history of TIA, stroke, or DVT.  ORTHOPEDIC:  She has multiple joint arthritis pain and history of  eczema.  Neurologic, psychiatric, ENT, and hematologic review of systems are all  negative.   PHYSICAL EXAM:  Blood pressure is 147/76, pulse is 65 and regular.  HEENT is unremarkable.  She has 2+ carotid pulses without bruit.  Chest  is clear to auscultation.  Cardiac exam is regular rate and rhythm  without murmur.  Abdomen is soft with a pulsatile left pelvic mass.  Abdomen is thin.  She has 2+ carotid and radial pulses bilaterally.  She  has absent femoral, popliteal, and pedal pulses in the right leg.  She  has a 2+ left femoral pulse.  She has absent popliteal and pedal pulses  in the left leg.   She had bilateral ABI performed today, which were 0.8 on the right and  0.93 on the left.  I reviewed her MRI scan today, which was a dedicated  spine MRI with a large right common iliac artery aneurysm that is easily  apparent and is approximately 4 cm in diameter.  The infrarenal aorta  was 2.9 cm  above the bifurcation.   SUMMARY:  The patient has a large iliac aneurysm, which certainly needs  to be considered for repair.  She, overall, is fairly frail due to her  preexisting comorbidities, especially her scoliosis and cerebral palsy.  I believe the next step in her workup will be a CT scan of the abdomen  and pelvis with contrast to further evaluate her iliac aneurysm.  She  will follow up next week and, at that time, we will consider whether or  not she might be a candidate for endovascular stent graft repair of this  aneurysm.   Janetta Hora. Fields, MD  Electronically Signed   CEF/MEDQ  D:  02/09/2008  T:  02/09/2008  Job:  [id number]   cc:   Marlowe Kays, M.D.  Lovenia Kim, D.O.

## 2010-10-24 NOTE — Op Note (Signed)
[facility]  Patient:    Katrina Meadows, Katrina Meadows [facility] Visit Number: [id number] MRN: [id number]          Service Type: DSU Location: [facility] Attending Physician:  Katrina Meadows Dictated by:   Katrina Meadows, M.D. Proc. Date: 10/11/01 Admit Date:  10/11/2001   CC:         Katrina Meadows, M.D.   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  OPERATION PERFORMED:  Wide excision of left intranasal squamous cell carcinoma with full thickness skin graft from left supraclavicular region.  SURGEON:  Katrina Garbe. Ezzard Meadows, M.D.  ANESTHESIA:  Local 1% Xylocaine with 1:100,000 epinephrine.  COMPLICATIONS:  None.  INDICATIONS FOR PROCEDURE:  The patient is a 77 year old female who has had a chronic drying, crusting sore in the left side of the nose for several years. On exam in the office she has a superficial ulceration in the left side of her nose and septum.  A biopsy was obtained and was consistent with squamous cell carcinoma superficial.  The patient was taken to the operating room at this time for excision of squamous cell carcinoma of the left nostril and left septum with a skin graft.  DESCRIPTION OF PROCEDURE:  The patient was brought to the operating room.  The nose was prepped with Betadine solution and draped out with sterile towels. The nose was then injected with 10 cc of Xylocaine with epinephrine for local anesthetic and hemostasis.  The edges of the gross visualization of the carcinoma were outlined with the marking pen and then this was totally excised back to what appeared to be normal tissue.  This included 50% of the columella skin as well as the upper skin of the left nostril down onto the left side of the left nostril.  Intranasally, the  area which appeared to be most ulcerative was superior and medial along the septum and superior vault of the left nostril.  There were some areas along the mucosa of the septum which were ulcerated into the cartilage.  Because of this, a portion of the cartilage was resected with the specimen along with a fair amount of mucoperiosteum and mucoperichondrium around the ulceration.  This extended approximately 3 to 3.5 posteriorly on the septum and extended back to the anterior edge of the left inferior turbinate.  The specimen was excised, was marked with a long suture along the superior nasal valve area and a second smaller suture along the columella inferiorly on the left side.  The specimen was sent to pathology.  A full thickness skin graft was harvested from the left supraclavicular area and this was closed with a simple running 5-0 nylon suture.  Skin graft was then placed to the nose, was secured to the edges anteriorly with 5-0 plain gut, 5-0 nylon sutures and then was placed back into the nose along the septum and covered approximately 75% of the resected area. Some of the more posterior septum was left exposed.  The nose was then packed with 1/2-inch nonadherent gauze soaked in bacitracin ointment.  Katrina Meadows tolerated the procedure well and was subsequently sent to the recovery room postoperatively doing well.  DISPOSITION:  She is discharged home on Keflex 500 mg b.i.d. for one week, Tylenol and Darvocet-N 100 one to two q.4h. p.r.n. pain.  Will have her follow up in  my office in six days for recheck and removal of the packing. Dictated by:   Katrina Meadows, M.D. Attending Physician:  Katrina Meadows DD:  10/11/01 TD:  10/12/01 Job: [id number]

## 2011-03-09 LAB — BLOOD GAS, ARTERIAL
Bicarbonate: 26.7 — ABNORMAL HIGH
Drawn by: 296031
FIO2: 0.21
O2 Content: 2
O2 Saturation: 97.6
Patient temperature: 98.1
Patient temperature: 98.6
TCO2: 28
pCO2 arterial: 39.6
pCO2 arterial: 44.3
pH, Arterial: 7.488 — ABNORMAL HIGH

## 2011-03-09 LAB — URINALYSIS, ROUTINE W REFLEX MICROSCOPIC
Bilirubin Urine: NEGATIVE
Glucose, UA: NEGATIVE
Ketones, ur: NEGATIVE
pH: 5.5

## 2011-03-09 LAB — COMPREHENSIVE METABOLIC PANEL
ALT: 11
AST: 11
AST: 17
BUN: 5 — ABNORMAL LOW
CO2: 23
CO2: 29
Calcium: 7.7 — ABNORMAL LOW
Calcium: 9.3
Creatinine, Ser: 0.54
GFR calc Af Amer: >60
GFR calc Af Amer: >60
GFR calc non Af Amer: >60
GFR calc non Af Amer: >60
Glucose, Bld: 148 — ABNORMAL HIGH
Potassium: 3.9
Sodium: 140
Total Protein: 6

## 2011-03-09 LAB — MAGNESIUM: Magnesium: 1.3 — ABNORMAL LOW

## 2011-03-09 LAB — BASIC METABOLIC PANEL
BUN: 13
BUN: 7
BUN: 7
Calcium: 9
Chloride: 106
Chloride: 109
Creatinine, Ser: 0.42
Creatinine, Ser: 0.49
Creatinine, Ser: 0.61
GFR calc non Af Amer: >60
GFR calc non Af Amer: >60
GFR calc non Af Amer: >60
Glucose, Bld: 143 — ABNORMAL HIGH
Glucose, Bld: 94

## 2011-03-09 LAB — TYPE AND SCREEN: ABO/RH(D): A POS

## 2011-03-09 LAB — POCT I-STAT, CHEM 8
Chloride: 105
Creatinine, Ser: 0.8
HCT: 44
Sodium: 141

## 2011-03-09 LAB — CBC
HCT: 24.8 — ABNORMAL LOW
Hemoglobin: 14.1
MCHC: 33.6
MCHC: 34
MCV: 91.8
MCV: 93.8
Platelets: 123 — ABNORMAL LOW
RBC: 2.64 — ABNORMAL LOW
RDW: 13.6

## 2011-03-09 LAB — URINE MICROSCOPIC-ADD ON

## 2011-03-09 LAB — PREPARE RBC (CROSSMATCH)

## 2011-03-09 LAB — APTT: aPTT: 35

## 2011-03-09 LAB — GLUCOSE, CAPILLARY: Glucose-Capillary: 108 — ABNORMAL HIGH

## 2011-03-09 LAB — PROTIME-INR: INR: 1.3

## 2011-03-10 LAB — BASIC METABOLIC PANEL
BUN: 5 — ABNORMAL LOW
CO2: 27
Calcium: 8.3 — ABNORMAL LOW
Calcium: 8.4
Chloride: 107
Creatinine, Ser: 0.54
Creatinine, Ser: 0.58
GFR calc Af Amer: >60
GFR calc Af Amer: >60
Sodium: 141

## 2011-03-10 LAB — CBC
Hemoglobin: 9.8 — ABNORMAL LOW
MCHC: 33.8
MCHC: 34.3
MCV: 89.7
Platelets: 95 — ABNORMAL LOW
RBC: 3.21 — ABNORMAL LOW
RBC: 3.45 — ABNORMAL LOW
WBC: 8.2
WBC: 9

## 2013-05-09 ENCOUNTER — Encounter: Payer: Self-pay | Admitting: Internal Medicine

## 2013-05-10 ENCOUNTER — Ambulatory Visit (INDEPENDENT_AMBULATORY_CARE_PROVIDER_SITE_OTHER): Payer: MEDICARE | Admitting: Internal Medicine

## 2013-05-10 ENCOUNTER — Encounter: Payer: Self-pay | Admitting: Internal Medicine

## 2013-05-10 VITALS — BP 122/70 | HR 56 | Temp 98.2°F | Resp 16

## 2013-05-10 DIAGNOSIS — D638 Anemia in other chronic diseases classified elsewhere: Secondary | ICD-10-CM | POA: Insufficient documentation

## 2013-05-10 DIAGNOSIS — D649 Anemia, unspecified: Secondary | ICD-10-CM

## 2013-05-10 DIAGNOSIS — I10 Essential (primary) hypertension: Secondary | ICD-10-CM | POA: Insufficient documentation

## 2013-05-10 DIAGNOSIS — R7309 Other abnormal glucose: Secondary | ICD-10-CM

## 2013-05-10 DIAGNOSIS — R413 Other amnesia: Secondary | ICD-10-CM

## 2013-05-10 DIAGNOSIS — E782 Mixed hyperlipidemia: Secondary | ICD-10-CM

## 2013-05-10 DIAGNOSIS — E559 Vitamin D deficiency, unspecified: Secondary | ICD-10-CM | POA: Insufficient documentation

## 2013-05-10 DIAGNOSIS — E039 Hypothyroidism, unspecified: Secondary | ICD-10-CM | POA: Insufficient documentation

## 2013-05-10 NOTE — Progress Notes (Signed)
Patient ID: Katrina Meadows, female   DOB: Mar 31, 1929, 79 y.o.   MRN: [id number]   This very nice 79 yo WWF presents for 1 month follow up of Hypertension with BP 148/90 in October and 122/70 toda. BP has been controlled at home. Today's BP is 122/70. Patient denies any cardiac type chest pain, palpitations, dyspnea/orthopnea/PND, dizziness, claudication, or dependent edema.   Hyperlipidemia is controlled with diet & meds. Last cholesterol was 193, Triglycerides were  161, HDL  72 and LDL  89  - at goal. Patient denies myalgias or other med SE's.    Also, the patient has history of prediabetes/insulin resistance with last A1c of  5.7% in October. Patient denies any symptoms of reactive hypoglycemia, diabetic polys, paresthesias or visual blurring.   Hx/o Cerebral sx's and stable into adulthood despite long hx/o limited mobility.   Further, Patient has history of vitamin D deficiency with last vitamin D of 69. Patient supplements vitamin D without any suspected side-effects.  Current Outpatient Prescriptions on File Prior to Visit  Medication Sig Dispense Refill  . atenolol (TENORMIN) 50 MG tablet Take 50 mg by mouth daily.      . cholecalciferol (VITAMIN D) 1000 UNITS tablet Take 2,000 Units by mouth daily.      . hydrochlorothiazide (HYDRODIURIL) 25 MG tablet Take 25 mg by mouth daily.      Marland Kitchen levothyroxine (SYNTHROID, LEVOTHROID) 50 MCG tablet Take 50 mcg by mouth daily before breakfast.      . vitamin C (ASCORBIC ACID) 500 MG tablet Take 500 mg by mouth daily.       No current facility-administered medications on file prior to visit.     Allergies  Allergen Reactions  . Ace Inhibitors   . Clindamycin/Lincomycin   . Codeine   . Lotrel [Amlodipine Besy-Benazepril Hcl]   . Nickel   . Sulfa Antibiotics     PMHx:   Past Medical History  Diagnosis Date  . Mumps   . Hypertension   . Hyperlipidemia   . Pre-diabetes   . Thyroid disease     hypothyroid  . Scoliosis   . Sacral fracture  2009  . Tobacco dependence   . Ankle instability     both   . Cerebral palsy   . Allergy     FHx:    Reviewed / unchanged  SHx:    Reviewed / unchanged  Systems Review: Constitutional: Denies fever, chills, wt changes, headaches, insomnia, fatigue, night sweats, change in appetite. Eyes: Denies redness, blurred vision, diplopia, discharge, itchy, watery eyes.  ENT: Denies discharge, congestion, post nasal drip, epistaxis, sore throat, earache, hearing loss, dental pain, tinnitus, vertigo, sinus pain, snoring.  CV: Denies chest pain, palpitations, irregular heartbeat, syncope, dyspnea, diaphoresis, orthopnea, PND, claudication, edema. Respiratory: denies cough, dyspnea, DOE, pleurisy, hoarseness, laryngitis, wheezing.  Gastrointestinal: Denies dysphagia, odynophagia, heartburn, reflux, water brash, abdominal pain or cramps, nausea, vomiting, bloating, diarrhea, constipation, hematemesis, melena, hematochezia,  or hemorrhoids. Genitourinary: Denies dysuria, frequency, urgency, nocturia, hesitancy, discharge, hematuria, flank pain. Musculoskeletal: Restricted to wheelchair. Denies arthralgias, myalgias, stiffness, jt. swelling, pain, limp, strain/sprain.  Skin: Denies pruritus, rash, hives, warts, acne, eczema, change in skin lesion(s). Neuro: No weakness, tremor, incoordination, spasms, paresthesia, or pain. Psychiatric: Denies confusion, memory loss, or sensory loss. Endo: Denies change in weight, skin, hair change.  Heme/Lymph: No excessive bleeding, bruising, orenlarged lymph nodes.  Filed Vitals:   05/10/13 1509  BP: 122/70  Pulse: 56  Temp: 98.2 F (36.8 C)  Resp: 16  There is no height or weight on file to calculate BMI.  On Exam: Appears well nourished - in no distress. Eyes: PERRLA, EOMs, conjunctiva no swelling or erythema. Sinuses: No frontal/maxillary tenderness ENT/Mouth: EAC's clear, TM's nl w/o erythema, bulging. Nares clear w/o erythema, swelling, exudates.  Oropharynx clear without erythema or exudates. Oral hygiene is good. Tongue normal, non obstructing. Hearing intact.  Neck: Supple. Thyroid nl. Car 2+/2+ without bruits, nodes or JVD. Chest: Respirations nl with BS clear & equal w/o rales, rhonchi, wheezing or stridor.  Cor: Heart sounds normal w/ regular rate and rhythm without sig. murmurs, gallops, clicks, or rubs. Peripheral pulses normal and equal  without edema.  Abdomen: Soft & bowel sounds normal. Non-tender w/o guarding, rebound, hernias, masses, or organomegaly.  Lymphatics: Unremarkable.  Musculoskeletal: Severe S- thoracolumbar kyphoscoliosis. Decreased ROM of shoulders. Unable to stand from her W/C due to generalized decreased muscular wasting. Neuro: Cranial nerves intact, reflexes equal bilaterally. Sensory-motor testing grossly intact. Tendon reflexes grossly intact.  Skin: Warm, dry without exposed rashes, lesions, ecchymosis apparent.  Pysch: Alert & oriented x 3. Insight and judgement nl & appropriate. No ideations.  Assessment and Plan:  1. Hypertension - Continue monitor blood pressure at home. Continue diet/meds same.  2. Hyperlipidemia - Continue diet/meds, exercise,& lifestyle modifications. Continue monitor periodic cholesterol/liver & renal functions   3. Pre-diabetes/Insulin Resistance - Continue diet, exercise, lifestyle modifications. Monitor appropriate labs.  3. Diabetes - continue recommend prudent low glycemic diet, weight control, regular exercise, diabetic monitoring and periodic eye exams.  4. Vitamin D Deficiency - Continue supplementation  5, Cerebral Palsy with spastic quadriparesis  Further disposition pending results of labs.

## 2013-05-10 NOTE — Patient Instructions (Signed)

## 2013-05-10 NOTE — Progress Notes (Addendum)
Patient ID: Katrina Meadows, female   DOB: 24-Jan-1929, 79 y.o.   MRN: [id number]   This very nice  79 yo DWF presents for 3 month follow up with Hypertension, Hyperlipidemia, pre-diabetes and Vitamin D deficiency.    BP has been controlled at home. Today's BP is  122/70. Patient denies any cardiac type chest pain, palpitations, dyspnea/orthopnea/PND, dizziness, claudication, or dependent edema.   Hyperlipidemia is controlled with diet & meds. Last cholesterol was  , Triglycerides were    , HDL   , and LDL    . Patient denies myalgias or other med SE's.    Also, the patient has history of prediabetes/insulin resistance with last A1c of    . Patient denies any symptoms of reactive hypoglycemia, diabetic polys, paresthesias or visual blurring.   Further, Patient has history of vitamin D deficiency with last vitamin D of   . Patient supplements vitamin D without any suspected side-effects.  Current Outpatient Prescriptions on File Prior to Visit  Medication Sig Dispense Refill  . atenolol (TENORMIN) 50 MG tablet Take 50 mg by mouth daily.      . cholecalciferol (VITAMIN D) 1000 UNITS tablet Take 2,000 Units by mouth daily.      . hydrochlorothiazide (HYDRODIURIL) 25 MG tablet Take 25 mg by mouth daily.      Marland Kitchen levothyroxine (SYNTHROID, LEVOTHROID) 50 MCG tablet Take 50 mcg by mouth daily before breakfast.      . vitamin C (ASCORBIC ACID) 500 MG tablet Take 500 mg by mouth daily.       No current facility-administered medications on file prior to visit.     Allergies  Allergen Reactions  . Ace Inhibitors   . Clindamycin/Lincomycin   . Codeine   . Lotrel [Amlodipine Besy-Benazepril Hcl]   . Nickel   . Sulfa Antibiotics     PMHx:   Past Medical History  Diagnosis Date  . Mumps   . Hypertension   . Hyperlipidemia   . Pre-diabetes   . Thyroid disease     hypothyroid  . Scoliosis   . Sacral fracture 2009  . Tobacco dependence   . Ankle instability     both   . Cerebral palsy   .  Allergy     FHx:    Reviewed / unchanged  SHx:    Reviewed / unchanged  Systems Review: Constitutional: Denies fever, chills, wt changes, headaches, insomnia, fatigue, night sweats, change in appetite. Eyes: Denies redness, blurred vision, diplopia, discharge, itchy, watery eyes.  ENT: Denies discharge, congestion, post nasal drip, epistaxis, sore throat, earache, hearing loss, dental pain, tinnitus, vertigo, sinus pain, snoring.  CV: Denies chest pain, palpitations, irregular heartbeat, syncope, dyspnea, diaphoresis, orthopnea, PND, claudication, edema. Respiratory: denies cough, dyspnea, DOE, pleurisy, hoarseness, laryngitis, wheezing.  Gastrointestinal: Denies dysphagia, odynophagia, heartburn, reflux, water brash, abdominal pain or cramps, nausea, vomiting, bloating, diarrhea, constipation, hematemesis, melena, hematochezia,  or hemorrhoids. Genitourinary: Denies dysuria, frequency, urgency, nocturia, hesitancy, discharge, hematuria, flank pain. Musculoskeletal: Denies arthralgias, myalgias, stiffness, jt. swelling, pain, limp, strain/sprain.  Skin: Denies pruritus, rash, hives, warts, acne, eczema, change in skin lesion(s). Neuro: No weakness, tremor, incoordination, spasms, paresthesia, or pain. Psychiatric: Denies confusion, memory loss, or sensory loss. Endo: Denies change in weight, skin, hair change.  Heme/Lymph: No excessive bleeding, bruising, orenlarged lymph nodes.  Filed Vitals:   05/10/13 1509  BP: 122/70  Pulse: 56  Temp: 98.2 F (36.8 C)  Resp: 16    height or weight not measured - patient in  W/C  On Exam: Appears well nourished - in no distress. Eyes: PERRLA, EOMs, conjunctiva no swelling or erythema. Sinuses: No frontal/maxillary tenderness. Speech - sl scanning ENT/Mouth: EAC's clear, TM's nl w/o erythema, bulging. Nares clear w/o erythema, swelling, exudates. Oropharynx clear without erythema or exudates. Oral hygiene is good. Tongue normal, non obstructing.  Hearing intact.  Neck: Supple. Thyroid nl. Car 2+/2+ without bruits, nodes or JVD. Chest: Kyphoscoliosis deformity.  Respirations nl with BS clear & equal w/o rales, rhonchi, wheezing or stridor.  Cor: Heart sounds normal w/ regular rate and rhythm without sig. murmurs, gallops, clicks, or rubs. Peripheral pulses normal and equal  without edema.  Abdomen: Soft & bowel sounds normal. Non-tender w/o guarding, rebound, hernias, masses, or organomegaly.  Lymphatics: Unremarkable.  Musculoskeletal: Unable to stand - generalized increased tone with decreased power and bulk throughout. Mild generalized spastic quadriparesis with poor cerebellar coordination throughout. No tremor.   Skin: Warm, dry without exposed rashes, lesions, ecchymosis apparent.  Neuro: Cranial nerves intact, reflexes equal bilaterally. Sensory-motor testing grossly intact. Tendon reflexes grossly intact.  Pysch: Alert & oriented x 3. Insight and judgement nl & appropriate. No ideations.  Assessment and Plan:  1. Hypertension - Continue monitor blood pressure at home. Continue diet/meds same.  2. Hyperlipidemia - Continue diet/meds, exercise,& lifestyle modifications. Continue monitor periodic cholesterol/liver & renal functions   3. Pre-diabetes/Insulin Resistance - Continue diet, exercise, lifestyle modifications. Monitor appropriate labs.  3. Diabetes - continue recommend prudent low glycemic diet, weight control, regular exercise, diabetic monitoring and periodic eye exams.  4. Vitamin D Deficiency - Continue supplementation.  5. Spastic Quadriparesis 2 CP.  Further disposition pending results of labs.

## 2013-07-25 ENCOUNTER — Ambulatory Visit: Payer: Self-pay | Admitting: Internal Medicine

## 2013-09-05 ENCOUNTER — Other Ambulatory Visit: Payer: Self-pay | Admitting: Internal Medicine

## 2013-09-13 ENCOUNTER — Ambulatory Visit: Payer: Self-pay | Admitting: Physician Assistant

## 2013-11-04 ENCOUNTER — Other Ambulatory Visit: Payer: Self-pay | Admitting: Internal Medicine

## 2013-11-04 ENCOUNTER — Other Ambulatory Visit: Payer: Self-pay | Admitting: Emergency Medicine

## 2013-11-10 ENCOUNTER — Emergency Department (HOSPITAL_COMMUNITY)
Admission: EM | Admit: 2013-11-10 | Discharge: 2013-11-10 | Disposition: A | Discharge status: Home / Self Care | Payer: Medicare Other | Source: Home / Self Care | Attending: Family Medicine | Admitting: Family Medicine

## 2013-11-10 ENCOUNTER — Encounter (HOSPITAL_COMMUNITY): Payer: Self-pay | Admitting: Emergency Medicine

## 2013-11-10 DIAGNOSIS — H5712 Ocular pain, left eye: Secondary | ICD-10-CM

## 2013-11-10 DIAGNOSIS — IMO0002 Reserved for concepts with insufficient information to code with codable children: Secondary | ICD-10-CM | POA: Diagnosis not present

## 2013-11-10 DIAGNOSIS — S0502XA Injury of conjunctiva and corneal abrasion without foreign body, left eye, initial encounter: Secondary | ICD-10-CM

## 2013-11-10 DIAGNOSIS — S058X9A Other injuries of unspecified eye and orbit, initial encounter: Secondary | ICD-10-CM | POA: Diagnosis not present

## 2013-11-10 DIAGNOSIS — H1032 Unspecified acute conjunctivitis, left eye: Secondary | ICD-10-CM

## 2013-11-10 MED ORDER — TOBRAMYCIN-DEXAMETHASONE 0.3-0.1 % OP SUSP: 1.0000 [drp] | OPHTHALMIC | Status: DC

## 2013-11-10 MED ORDER — TETRACAINE HCL 0.5 % OP SOLN
OPHTHALMIC | Status: AC
  Filled 2013-11-10: qty 2

## 2013-11-10 NOTE — ED Notes (Signed)
Left eye injury that occurred 5/23-Saturday.  Patient reports she was pulling up pants, left hand slipped and left thumb nail jabbed into left eye.  Eye is red, field of vision is different then prior to injury, patient has visual changes.

## 2013-11-10 NOTE — Discharge Instructions (Signed)
Conjunctivitis Conjunctivitis is commonly called "pink eye." Conjunctivitis can be caused by bacterial or viral infection, allergies, or injuries. There is usually redness of the lining of the eye, itching, discomfort, and sometimes discharge. There may be deposits of matter along the eyelids. A viral infection usually causes a watery discharge, while a bacterial infection causes a yellowish, thick discharge. Pink eye is very contagious and spreads by direct contact. You may be given antibiotic eyedrops as part of your treatment. Before using your eye medicine, remove all drainage from the eye by washing gently with warm water and cotton balls. Continue to use the medication until you have awakened 2 mornings in a row without discharge from the eye. Do not rub your eye. This increases the irritation and helps spread infection. Use separate towels from other household members. Wash your hands with soap and water before and after touching your eyes. Use cold compresses to reduce pain and sunglasses to relieve irritation from light. Do not wear contact lenses or wear eye makeup until the infection is gone. SEEK MEDICAL CARE IF:   Your symptoms are not better after 3 days of treatment.  You have increased pain or trouble seeing.  The outer eyelids become very red or swollen. Document Released: 07/02/2004 Document Revised: 08/17/2011 Document Reviewed: 05/25/2005 [facility] Patient Information 2014 [place], [place].  Corneal Abrasion The cornea is the clear covering at the front and center of the eye. When looking at the colored portion of the eye (iris), you are looking through the cornea. This very thin tissue is made up of many layers. The surface layer is a single layer of cells (corneal epithelium) and is one of the most sensitive tissues in the body. If a scratch or injury causes the corneal epithelium to come off, it is called a corneal abrasion. If the injury extends to the tissues below the  epithelium, the condition is called a corneal ulcer. CAUSES   Scratches.  Trauma.  Foreign body in the eye. Some people have recurrences of abrasions in the area of the original injury even after it has healed (recurrent erosion syndrome). Recurrent erosion syndrome generally improves and goes away with time. SYMPTOMS   Eye pain.  Difficulty or inability to keep the injured eye open.  The eye becomes very sensitive to light.  Recurrent erosions tend to happen suddenly, first thing in the morning, usually after waking up and opening the eye. DIAGNOSIS  Your health care provider can diagnose a corneal abrasion during an eye exam. Dye is usually placed in the eye using a drop or a small paper strip moistened by your tears. When the eye is examined with a special light, the abrasion shows up clearly because of the dye. TREATMENT   Small abrasions may be treated with antibiotic drops or ointment alone.  Usually a pressure patch is specially applied. Pressure patches prevent the eye from blinking, allowing the corneal epithelium to heal. A pressure patch also reduces the amount of pain present in the eye during healing. Most corneal abrasions heal within 2 3 days with no effect on vision. If the abrasion becomes infected and spreads to the deeper tissues of the cornea, a corneal ulcer can result. This is serious because it can cause corneal scarring. Corneal scars interfere with light passing through the cornea and cause a loss of vision in the involved eye. HOME CARE INSTRUCTIONS  Use medicine or ointment as directed. Only take over-the-counter or prescription medicines for pain, discomfort, or fever as directed  by your health care provider.  Do not drive or operate machinery while your eye is patched. Your ability to judge distances is impaired.  If your health care provider has given you a follow-up appointment, it is very important to keep that appointment. Not keeping the appointment  could result in a severe eye infection or permanent loss of vision. If there is any problem keeping the appointment, let your health care provider know. SEEK MEDICAL CARE IF:   You have pain, light sensitivity, and a scratchy feeling in one eye or both eyes.  Your pressure patch keeps loosening up, and you can blink your eye under the patch after treatment.  Any kind of discharge develops from the eye after treatment or if the lids stick together in the morning.  You have the same symptoms in the morning as you did with the original abrasion days, weeks, or months after the abrasion healed. MAKE SURE YOU:   Understand these instructions.  Will watch your condition.  Will get help right away if you are not doing well or get worse. Document Released: 05/22/2000 Document Revised: 03/15/2013 Document Reviewed: 01/30/2013 [facility] Patient Information 2014 [place], [place].  Blurred Vision You have been seen today complaining of blurred vision. This means you have a loss of ability to see small details.  CAUSES  Blurred vision can be a symptom of underlying eye problems, such as:  Aging of the eye (presbyopia).  Glaucoma.  Cataracts.  Eye infection.  Eye-related migraine.  Diabetes mellitus.  Fatigue.  Migraine headaches.  High blood pressure.  Breakdown of the back of the eye (macular degeneration).  Problems caused by some medications. The most common cause of blurred vision is the need for eyeglasses or a new prescription. Today in the emergency department, no cause for your blurred vision can be found. SYMPTOMS  Blurred vision is the loss of visual sharpness and detail (acuity). DIAGNOSIS  Should blurred vision continue, you should see your caregiver. If your caregiver is your primary care physician, he or she may choose to refer you to another specialist.  TREATMENT  Do not ignore your blurred vision. Make sure to have it checked out to see if further treatment or  referral is necessary. SEEK MEDICAL CARE IF:  You are unable to get into a specialist so we can help you with a referral. SEEK IMMEDIATE MEDICAL CARE IF: You have severe eye pain, severe headache, or sudden loss of vision. MAKE SURE YOU:   Understand these instructions.  Will watch your condition.  Will get help right away if you are not doing well or get worse. Document Released: 05/28/2003 Document Revised: 08/17/2011 Document Reviewed: 12/28/2007 [facility] Patient Information 2014 [place], [place].

## 2013-11-10 NOTE — ED Provider Notes (Signed)
CSN: [id number]     Arrival date & time 11/10/13  1259 History   First MD Initiated Contact with Patient 11/10/13 1317     Chief Complaint  Patient presents with  . Eye Injury   (Consider location/radiation/quality/duration/timing/severity/associated sxs/prior Treatment) HPI Comments:  a pleasant 80 year old female who presents with left back pain. Proximally he came to 12 days ago she was picking on her pants in her left hand slipped and as a result the thumbnail struck her in the left eye. She has had pain and redness in the left eye since that time. She is complaining of a clear drainage with puffiness around the left. She also states that there is some diminishing of vision.    Past Medical History  Diagnosis Date  . Hypertension    Past Surgical History  Procedure Laterality Date  . Abdominal aortic aneurysm repair     No family history on file. History  Substance Use Topics  . Smoking status: Current Every Day Smoker  . Smokeless tobacco: Not on file  . Alcohol Use: No   OB History   Grav Para Term Preterm Abortions TAB SAB Ect Mult Living                 Review of Systems  Constitutional: Negative for fever.  HENT: Negative.   Eyes: Positive for photophobia, pain, discharge, redness and visual disturbance.  Neurological: Negative for dizziness, tremors, syncope, facial asymmetry, speech difficulty and headaches.    Allergies  Review of patient's allergies indicates no known allergies.  Home Medications   Prior to Admission medications   Medication Sig Start Date End Date Taking? Authorizing Provider  Ascorbic Acid (VITAMIN C PO) Take by mouth.   Yes Historical Provider, MD  ATENOLOL PO Take by mouth.   Yes Historical Provider, MD  Cholecalciferol (VITAMIN D PO) Take by mouth.   Yes Historical Provider, MD  tobramycin-dexamethasone [facility]) ophthalmic solution Place 1 drop into the left eye every 4 (four) hours while awake. 11/10/13   Hayden Rasmussen, NP   BP 198/95   Pulse 78  Temp(Src) 98.2 F (36.8 C) (Oral)  Resp 18  SpO2 100% Physical Exam  Nursing note and vitals reviewed. Constitutional: She is oriented to person, place, and time. She appears well-developed and well-nourished. No distress.  HENT:  Head: Normocephalic and atraumatic.  Eyes:  Puffiness surrounding the OS. No erythema.  Swelling and erythema of the upper and lower inner lids and conjunctivae. Sclera injected, with areas of clear fluid pockets beneath the scleral conjunctiva. The anterior chamber is mildly cloudy but not much more than the OD. Although the bld vessel dilitations are prominent I dont appreciate a limbal flush. The pupil is small, almost pinpoint. EOM intact.. + photophobia.   Neurological: She is alert and oriented to person, place, and time. She exhibits normal muscle tone.  Skin: Skin is warm and dry.    ED Course  Irrigation Date/Time: 11/10/2013 3:25 PM Performed by: Phineas Real Siya Flurry Authorized by: Phineas Real, Eligha Kmetz Consent: Verbal consent obtained. Risks and benefits: risks, benefits and alternatives were discussed Consent given by: patient and guardian Patient understanding: patient states understanding of the procedure being performed Patient identity confirmed: verbally with patient Local anesthesia used: yes Local anesthetic: topical anesthetic Patient sedated: no Patient tolerance: Patient tolerated the procedure well with no immediate complications. Comments: Irrigation OS 120 cc eye wash.   (including critical care time) The left eye was inspected with magnification and white light, then tetracaine gtts 2  to the OS followed by fluorosceine stain. There is uptake over the cornea as a small speck and a "brush stroke " appearance to the inferior portion. No FB's seen. Irrigation by 4 oz of eye wash followed. Labs Review Labs Reviewed - No data to display  Imaging Review No results found.   MDM   1. Left corneal abrasion   2. Acute left eye pain   3.  Conjunctivitis, acute, left eye      Possible iritis OS.  tobradex as dir Warm compresses Call opthal soon and see next week.      Hayden Rasmussenavid Tauheedah Bok, NP 11/10/13 1549

## 2013-11-14 ENCOUNTER — Other Ambulatory Visit: Payer: Self-pay | Admitting: Internal Medicine

## 2013-11-21 ENCOUNTER — Encounter: Payer: Self-pay | Admitting: Internal Medicine

## 2013-11-21 NOTE — ED Provider Notes (Signed)
Medical screening examination/treatment/procedure(s) were performed by resident physician or non-physician practitioner and as supervising physician I was immediately available for consultation/collaboration.   KINDL,JAMES DOUGLAS MD.   James D Kindl, MD 11/21/13 1001 

## 2013-12-20 ENCOUNTER — Emergency Department (HOSPITAL_COMMUNITY): Payer: Medicare Other

## 2013-12-20 ENCOUNTER — Encounter (HOSPITAL_COMMUNITY): Payer: Self-pay | Admitting: Emergency Medicine

## 2013-12-20 ENCOUNTER — Inpatient Hospital Stay (HOSPITAL_COMMUNITY)
Admission: EM | Admit: 2013-12-20 | Discharge: 2013-12-23 | DRG: 313 | Disposition: A | Discharge status: Home / Self Care | Payer: Medicare Other | Attending: Cardiovascular Disease | Admitting: Cardiovascular Disease

## 2013-12-20 DIAGNOSIS — E782 Mixed hyperlipidemia: Secondary | ICD-10-CM | POA: Diagnosis present

## 2013-12-20 DIAGNOSIS — I7411 Embolism and thrombosis of thoracic aorta: Secondary | ICD-10-CM

## 2013-12-20 DIAGNOSIS — R079 Chest pain, unspecified: Secondary | ICD-10-CM | POA: Diagnosis present

## 2013-12-20 DIAGNOSIS — F17200 Nicotine dependence, unspecified, uncomplicated: Secondary | ICD-10-CM | POA: Diagnosis present

## 2013-12-20 DIAGNOSIS — J4489 Other specified chronic obstructive pulmonary disease: Secondary | ICD-10-CM | POA: Diagnosis present

## 2013-12-20 DIAGNOSIS — J449 Chronic obstructive pulmonary disease, unspecified: Secondary | ICD-10-CM | POA: Diagnosis present

## 2013-12-20 DIAGNOSIS — I700 Atherosclerosis of aorta: Secondary | ICD-10-CM | POA: Diagnosis present

## 2013-12-20 DIAGNOSIS — I509 Heart failure, unspecified: Secondary | ICD-10-CM | POA: Diagnosis present

## 2013-12-20 DIAGNOSIS — G809 Cerebral palsy, unspecified: Secondary | ICD-10-CM | POA: Diagnosis present

## 2013-12-20 DIAGNOSIS — I10 Essential (primary) hypertension: Secondary | ICD-10-CM | POA: Diagnosis present

## 2013-12-20 DIAGNOSIS — I2510 Atherosclerotic heart disease of native coronary artery without angina pectoris: Secondary | ICD-10-CM | POA: Diagnosis present

## 2013-12-20 DIAGNOSIS — Z66 Do not resuscitate: Secondary | ICD-10-CM | POA: Diagnosis present

## 2013-12-20 DIAGNOSIS — R109 Unspecified abdominal pain: Secondary | ICD-10-CM | POA: Insufficient documentation

## 2013-12-20 DIAGNOSIS — R0789 Other chest pain: Secondary | ICD-10-CM | POA: Diagnosis not present

## 2013-12-20 DIAGNOSIS — R0600 Dyspnea, unspecified: Secondary | ICD-10-CM

## 2013-12-20 DIAGNOSIS — I5189 Other ill-defined heart diseases: Secondary | ICD-10-CM | POA: Diagnosis present

## 2013-12-20 DIAGNOSIS — D649 Anemia, unspecified: Secondary | ICD-10-CM

## 2013-12-20 DIAGNOSIS — I313 Pericardial effusion (noninflammatory): Secondary | ICD-10-CM | POA: Diagnosis present

## 2013-12-20 DIAGNOSIS — R0609 Other forms of dyspnea: Secondary | ICD-10-CM

## 2013-12-20 DIAGNOSIS — E039 Hypothyroidism, unspecified: Secondary | ICD-10-CM | POA: Diagnosis present

## 2013-12-20 DIAGNOSIS — I7410 Embolism and thrombosis of unspecified parts of aorta: Secondary | ICD-10-CM | POA: Diagnosis present

## 2013-12-20 DIAGNOSIS — I3139 Other pericardial effusion (noninflammatory): Secondary | ICD-10-CM

## 2013-12-20 DIAGNOSIS — R0989 Other specified symptoms and signs involving the circulatory and respiratory systems: Secondary | ICD-10-CM

## 2013-12-20 DIAGNOSIS — Z79899 Other long term (current) drug therapy: Secondary | ICD-10-CM

## 2013-12-20 DIAGNOSIS — J90 Pleural effusion, not elsewhere classified: Secondary | ICD-10-CM

## 2013-12-20 DIAGNOSIS — I319 Disease of pericardium, unspecified: Secondary | ICD-10-CM | POA: Diagnosis present

## 2013-12-20 LAB — COMPREHENSIVE METABOLIC PANEL
ALK PHOS: 68 U/L (ref 39–117)
ALT: 8 U/L (ref 0–35)
AST: 14 U/L (ref 0–37)
Albumin: 3.4 g/dL — ABNORMAL LOW (ref 3.5–5.2)
Anion gap: 14 (ref 5–15)
BILIRUBIN TOTAL: 0.3 mg/dL (ref 0.3–1.2)
BUN: 22 mg/dL (ref 6–23)
CHLORIDE: 105 meq/L (ref 96–112)
CO2: 27 mEq/L (ref 19–32)
Calcium: 9.8 mg/dL (ref 8.4–10.5)
Creatinine, Ser: 0.71 mg/dL (ref 0.50–1.10)
GFR calc non Af Amer: 78 mL/min — ABNORMAL LOW (ref >90)
GFR, EST AFRICAN AMERICAN: 90 mL/min — AB (ref >90)
GLUCOSE: 124 mg/dL — AB (ref 70–99)
POTASSIUM: 3.9 meq/L (ref 3.7–5.3)
SODIUM: 146 meq/L (ref 137–147)
Total Protein: 6.5 g/dL (ref 6.0–8.3)

## 2013-12-20 LAB — CBC
HCT: 37.9 % (ref 36.0–46.0)
HEMOGLOBIN: 12.4 g/dL (ref 12.0–15.0)
MCH: 30.6 pg (ref 26.0–34.0)
MCHC: 32.7 g/dL (ref 30.0–36.0)
MCV: 93.6 fL (ref 78.0–100.0)
Platelets: 140 10*3/uL — ABNORMAL LOW (ref 150–400)
RBC: 4.05 MIL/uL (ref 3.87–5.11)
RDW: 13.2 % (ref 11.5–15.5)
WBC: 9.3 10*3/uL (ref 4.0–10.5)

## 2013-12-20 LAB — TROPONIN I

## 2013-12-20 LAB — URINALYSIS, ROUTINE W REFLEX MICROSCOPIC
Bilirubin Urine: NEGATIVE
GLUCOSE, UA: NEGATIVE mg/dL
Ketones, ur: NEGATIVE mg/dL
Leukocytes, UA: NEGATIVE
Nitrite: NEGATIVE
PROTEIN: NEGATIVE mg/dL
SPECIFIC GRAVITY, URINE: 1.027 (ref 1.005–1.030)
Urobilinogen, UA: 0.2 mg/dL (ref 0.0–1.0)
pH: 5 (ref 5.0–8.0)

## 2013-12-20 LAB — PRO B NATRIURETIC PEPTIDE: PRO B NATRI PEPTIDE: 398.9 pg/mL (ref 0–450)

## 2013-12-20 LAB — URINE MICROSCOPIC-ADD ON

## 2013-12-20 LAB — MRSA PCR SCREENING: MRSA by PCR: NEGATIVE

## 2013-12-20 LAB — TSH: TSH: 1.32 u[IU]/mL (ref 0.350–4.500)

## 2013-12-20 LAB — LIPASE, BLOOD: Lipase: 44 U/L (ref 11–59)

## 2013-12-20 MED ORDER — ACETAMINOPHEN 325 MG PO TABS
650.0000 mg | ORAL_TABLET | ORAL | Status: DC | PRN
  Administered 2013-12-23: 650 mg via ORAL
  Filled 2013-12-20: qty 2

## 2013-12-20 MED ORDER — ONDANSETRON HCL 4 MG/2ML IJ SOLN: 4.0000 mg | Freq: Four times a day (QID) | INTRAMUSCULAR | Status: DC | PRN

## 2013-12-20 MED ORDER — LEVOTHYROXINE SODIUM 50 MCG PO TABS
50.0000 ug | ORAL_TABLET | Freq: Every day | ORAL | Status: DC
  Administered 2013-12-21 – 2013-12-23 (×3): 50 ug via ORAL
  Filled 2013-12-20 (×4): qty 1

## 2013-12-20 MED ORDER — SODIUM CHLORIDE 0.9 % IV BOLUS (SEPSIS)
500.0000 mL | Freq: Once | INTRAVENOUS | Status: AC
  Administered 2013-12-20: 500 mL via INTRAVENOUS

## 2013-12-20 MED ORDER — NITROGLYCERIN IN D5W 200-5 MCG/ML-% IV SOLN
2.0000 ug/min | INTRAVENOUS | Status: DC
  Administered 2013-12-20: 10 ug/min via INTRAVENOUS
  Filled 2013-12-20 (×2): qty 250

## 2013-12-20 MED ORDER — IOHEXOL 350 MG/ML SOLN
100.0000 mL | Freq: Once | INTRAVENOUS | Status: AC | PRN
  Administered 2013-12-20: 100 mL via INTRAVENOUS

## 2013-12-20 MED ORDER — VITAMIN D3 25 MCG (1000 UNIT) PO TABS
1000.0000 [IU] | ORAL_TABLET | Freq: Every day | ORAL | Status: DC
  Administered 2013-12-21 – 2013-12-23 (×3): 1000 [IU] via ORAL
  Filled 2013-12-20 (×3): qty 1

## 2013-12-20 MED ORDER — ASPIRIN EC 81 MG PO TBEC
81.0000 mg | DELAYED_RELEASE_TABLET | Freq: Every day | ORAL | Status: DC
  Administered 2013-12-21 – 2013-12-23 (×3): 81 mg via ORAL
  Filled 2013-12-20 (×3): qty 1

## 2013-12-20 MED ORDER — NEBIVOLOL HCL 5 MG PO TABS
5.0000 mg | ORAL_TABLET | Freq: Every day | ORAL | Status: DC
  Administered 2013-12-21: 5 mg via ORAL
  Filled 2013-12-20 (×2): qty 1

## 2013-12-20 MED ORDER — ATORVASTATIN CALCIUM 20 MG PO TABS
20.0000 mg | ORAL_TABLET | Freq: Every day | ORAL | Status: DC
Start: 2013-12-20 — End: 2013-12-23
  Administered 2013-12-20 – 2013-12-21 (×2): 20 mg via ORAL
  Filled 2013-12-20 (×5): qty 1

## 2013-12-20 MED ORDER — VITAMIN C 500 MG PO TABS
500.0000 mg | ORAL_TABLET | Freq: Every day | ORAL | Status: DC
  Administered 2013-12-21 – 2013-12-23 (×3): 500 mg via ORAL
  Filled 2013-12-20 (×4): qty 1

## 2013-12-20 MED ORDER — FUROSEMIDE 10 MG/ML IJ SOLN
20.0000 mg | Freq: Two times a day (BID) | INTRAMUSCULAR | Status: DC
  Administered 2013-12-20 – 2013-12-21 (×2): 20 mg via INTRAVENOUS
  Filled 2013-12-20 (×2): qty 2

## 2013-12-20 MED ORDER — HEPARIN SODIUM (PORCINE) 5000 UNIT/ML IJ SOLN
5000.0000 [IU] | Freq: Three times a day (TID) | INTRAMUSCULAR | Status: DC
  Administered 2013-12-20 – 2013-12-21 (×2): 5000 [IU] via SUBCUTANEOUS
  Filled 2013-12-20 (×3): qty 1

## 2013-12-20 NOTE — Progress Notes (Signed)
  Patient's oxygen saturations drop to 88 when she sleeps. Placed on a nasal cannula at 2 L/M.

## 2013-12-20 NOTE — ED Notes (Signed)
Patient transported to CT, escorted by this nurse

## 2013-12-20 NOTE — ED Notes (Signed)
Pt returned from xray, placed back on monitor.

## 2013-12-20 NOTE — ED Notes (Signed)
Patient transported to X-ray 

## 2013-12-20 NOTE — ED Notes (Addendum)
Per patient: pain that was in her stomach for several days, today has moved up to between her throat and her breast. Describes the pain as dull and constant, was given tums yesterday and abdominal pain eased but then she felt pain in her stomach.  Pain is worse with deep inspiration, described as "feels like somebody put a heavy rock on my chest"  Currently tachycardic, tachypnic, hypertensive, but currently is afebrile but states she had chills last night.  Daughter at bedside, history of tobacco dependence, AAA repair, femoral stent placement

## 2013-12-20 NOTE — ED Provider Notes (Signed)
CSN: [id number]     Arrival date & time 12/20/13  1014 History   First MD Initiated Contact with Patient 12/20/13 1024     Chief Complaint  Patient presents with  . Chest Pain      (Consider location/radiation/quality/duration/timing/severity/associated sxs/prior Treatment) The history is provided by the patient.  pt c/o mid to lower chest pain for the past 1-2 days. Constant, dull, moderate, non radiating. No associated sob, nv or diaphoresis. Pain is not pleuritic. Occasional non prod cough, no other uri symptoms. No fever or chills. Pt states she has actually had epigastric pain x 'weeks/months', and that yesterday it moved to chest. Normal appetite. No nv. Having normal bms, no abd distension. No dysuria or gu c/o. No leg pain or swelling. No hx dvt or pe. No recent chest wall strain, trauma, or fall. Denies heartburn ?hx gerd.    Past Medical History  Diagnosis Date  . Mumps   . Hyperlipidemia   . Pre-diabetes   . Thyroid disease     hypothyroid  . Scoliosis   . Sacral fracture 2009  . Tobacco dependence   . Ankle instability     both   . Cerebral palsy   . Allergy   . Hypertension    Past Surgical History  Procedure Laterality Date  . Iliac artery stent Left 2009    with fem/fem bypass  . Abdominal aortic aneurysm repair     Family History  Problem Relation Age of Onset  . Alzheimer's disease Mother   . Stroke Father   . Heart attack Father   . Heart disease Father   . Alzheimer's disease Father   . Depression Daughter   . Breast cancer Maternal Aunt   . Cancer Maternal Aunt   . Colon cancer Maternal Aunt   . Heart disease Paternal Grandfather    History  Substance Use Topics  . Smoking status: Current Every Day Smoker -- 1.50 packs/day for 66 years    Types: Cigarettes  . Smokeless tobacco: Not on file  . Alcohol Use: No   OB History   Grav Para Term Preterm Abortions TAB SAB Ect Mult Living                 Review of Systems  Constitutional:  Negative for fever and chills.  HENT: Negative for sore throat.   Eyes: Negative for redness.  Respiratory: Positive for cough. Negative for shortness of breath.   Cardiovascular: Positive for chest pain.  Gastrointestinal: Negative for vomiting, abdominal pain and diarrhea.  Genitourinary: Negative for flank pain.  Musculoskeletal: Negative for back pain and neck pain.  Skin: Negative for rash.  Neurological: Negative for headaches.  Hematological: Does not bruise/bleed easily.  Psychiatric/Behavioral: Negative for confusion.      Allergies  Ace inhibitors; Clindamycin/lincomycin; Codeine; Lotrel; Nickel; and Sulfa antibiotics  Home Medications   Prior to Admission medications   Medication Sig Start Date End Date Taking? Authorizing Provider  Ascorbic Acid (VITAMIN C PO) Take by mouth.    Historical Provider, MD  atenolol (TENORMIN) 100 MG tablet TAKE 1 TABLET ONCE DAILY.    Quentin Mulling, PA-C  atenolol (TENORMIN) 50 MG tablet Take 50 mg by mouth daily.    Historical Provider, MD  ATENOLOL PO Take by mouth.    Historical Provider, MD  Cholecalciferol (VITAMIN D PO) Take by mouth.    Historical Provider, MD  cholecalciferol (VITAMIN D) 1000 UNITS tablet Take 2,000 Units by mouth daily.    Historical  Provider, MD  hydrochlorothiazide (HYDRODIURIL) 25 MG tablet Take 25 mg by mouth daily.    Historical Provider, MD  hydrochlorothiazide (MICROZIDE) 12.5 MG capsule TAKE 1 CAPSULE EACH MORNING.    Quentin Mulling, PA-C  levothyroxine (SYNTHROID, LEVOTHROID) 50 MCG tablet TAKE 1 TABLET ONCE DAILY. 11/14/13   Quentin Mulling, PA-C  tobramycin-dexamethasone [facility]) ophthalmic solution Place 1 drop into the left eye every 4 (four) hours while awake. 11/10/13   Hayden Rasmussen, NP  vitamin C (ASCORBIC ACID) 500 MG tablet Take 500 mg by mouth daily.    Historical Provider, MD   BP 181/103  Pulse 120  Temp(Src) 97.6 F (36.4 C) (Oral)  Resp 24  Ht 5\' 4"  (1.626 m)  Wt 115 lb (52.164 kg)  BMI  19.73 kg/m2  SpO2 95% Physical Exam  Nursing note and vitals reviewed. Constitutional: She appears well-developed and well-nourished. No distress.  HENT:  Mouth/Throat: Oropharynx is clear and moist.  Eyes: Conjunctivae are normal. Pupils are equal, round, and reactive to light. No scleral icterus.  Neck: Neck supple. No tracheal deviation present.  Cardiovascular: Regular rhythm, normal heart sounds and intact distal pulses.  Exam reveals no gallop and no friction rub.   No murmur heard. Pulmonary/Chest: Effort normal and breath sounds normal. No respiratory distress. She exhibits no tenderness.  Abdominal: Soft. Normal appearance and bowel sounds are normal. She exhibits no distension and no mass. There is no tenderness. There is no rebound and no guarding.  Genitourinary:  No cva tenderness  Musculoskeletal: She exhibits no edema and no tenderness.  Neurological: She is alert.  Skin: Skin is warm and dry. No rash noted. She is not diaphoretic.  Psychiatric: She has a normal mood and affect.    ED Course  Procedures (including critical care time) Labs Review   Results for orders placed during the hospital encounter of 12/20/13  CBC      Result Value Ref Range   WBC 9.3  4.0 - 10.5 K/uL   RBC 4.05  3.87 - 5.11 MIL/uL   Hemoglobin 12.4  12.0 - 15.0 g/dL   HCT 16.1  09.6 - 04.5 %   MCV 93.6  78.0 - 100.0 fL   MCH 30.6  26.0 - 34.0 pg   MCHC 32.7  30.0 - 36.0 g/dL   RDW 40.9  81.1 - 91.4 %   Platelets 140 (*) 150 - 400 K/uL  COMPREHENSIVE METABOLIC PANEL      Result Value Ref Range   Sodium 146  137 - 147 mEq/L   Potassium 3.9  3.7 - 5.3 mEq/L   Chloride 105  96 - 112 mEq/L   CO2 27  19 - 32 mEq/L   Glucose, Bld 124 (*) 70 - 99 mg/dL   BUN 22  6 - 23 mg/dL   Creatinine, Ser 7.82  0.50 - 1.10 mg/dL   Calcium 9.8  8.4 - 95.6 mg/dL   Total Protein 6.5  6.0 - 8.3 g/dL   Albumin 3.4 (*) 3.5 - 5.2 g/dL   AST 14  0 - 37 U/L   ALT 8  0 - 35 U/L   Alkaline Phosphatase 68  39  - 117 U/L   Total Bilirubin 0.3  0.3 - 1.2 mg/dL   GFR calc non Af Amer 78 (*) >90 mL/min   GFR calc Af Amer 90 (*) >90 mL/min   Anion gap 14  5 - 15  LIPASE, BLOOD      Result Value Ref Range  Lipase 44  11 - 59 U/L  URINALYSIS, ROUTINE W REFLEX MICROSCOPIC      Result Value Ref Range   Color, Urine YELLOW  YELLOW   APPearance CLEAR  CLEAR   Specific Gravity, Urine 1.027  1.005 - 1.030   pH 5.0  5.0 - 8.0   Glucose, UA NEGATIVE  NEGATIVE mg/dL   Hgb urine dipstick TRACE (*) NEGATIVE   Bilirubin Urine NEGATIVE  NEGATIVE   Ketones, ur NEGATIVE  NEGATIVE mg/dL   Protein, ur NEGATIVE  NEGATIVE mg/dL   Urobilinogen, UA 0.2  0.0 - 1.0 mg/dL   Nitrite NEGATIVE  NEGATIVE   Leukocytes, UA NEGATIVE  NEGATIVE  TROPONIN I      Result Value Ref Range   Troponin I <0.30  <0.30 ng/mL  URINE MICROSCOPIC-ADD ON      Result Value Ref Range   Squamous Epithelial / LPF FEW (*) RARE   WBC, UA 3-6  <3 WBC/hpf   RBC / HPF 0-2  <3 RBC/hpf   Bacteria, UA FEW (*) RARE   Casts GRANULAR CAST (*) NEGATIVE   Dg Chest 2 View  12/20/2013   CLINICAL DATA:  Inspiratory chest pain  EXAM: CHEST  2 VIEW  COMPARISON:  Portable chest x-ray of March 07, 2008 and PA and lateral chest x-ray of March 02, 2008.  FINDINGS: The lungs are hyperinflated with hemidiaphragm flattening. There is no focal infiltrate. There is no pneumothorax or pneumomediastinum or pleural effusion. The heart is mildly enlarged. The pulmonary vascularity is not engorged. There is tortuosity of the ascending and descending thoracic aorta. There is degenerative change of both shoulders greater on the right than on the left. There is stable mild compression of the T8 vertebral body.  IMPRESSION: COPD without evidence of pneumonia, pleural effusion, nor pulmonary edema. Stable mild cardiomegaly.   Electronically Signed   By: David  [place]   On: 12/20/2013 11:08   Ct Angio Chest Pe W/cm &/or Wo Cm  12/20/2013   CLINICAL DATA:  Chest pain   EXAM: CT ANGIOGRAPHY CHEST WITH CONTRAST  TECHNIQUE: Multidetector CT imaging of the chest was performed using the standard protocol during bolus administration of intravenous contrast. Multiplanar CT image reconstructions and MIPs were obtained to evaluate the vascular anatomy.  CONTRAST:  100mL OMNIPAQUE IOHEXOL 350 MG/ML SOLN intravenously  COMPARISON:  PA and lateral chest of December 20, 2013  FINDINGS: Contrast within the pulmonary arterial tree is normal. There are no filling defects to suggest an acute pulmonary embolism. The caliber of the thoracic aorta is normal. There is considerable mural thrombus an ulcerated plaque. There is no false lumen. There is a moderate-sized pericardial effusion. The cardiac chambers are enlarged. There are coronary artery calcifications. There is no mediastinal or hilar lymphadenopathy. The thoracic esophagus exhibits normal caliber. There is a small left pleural effusion and trace right pleural effusion.  There are emphysematous changes in the lungs. There is compressive atelectasis posteriorly at the bases. There is pleural thickening along the lateral aspect of the lingula. There is no alveolar pneumonia.  Within the upper abdomen the observed portions of the liver and spleen are normal.  There is mild wedge compression of the body of T8 slightly more prominent than on a lateral chest x-ray of March 02, 2008. The sternum is intact. The observed ribs exhibit no acute abnormalities.  Review of the MIP images confirms the above findings.  IMPRESSION: 1. There is no acute pulmonary embolism nor evidence of thoracic aortic dissection  or aneurysm. There is extensive mural thrombus and ulcerated plaque throughout the thoracic aorta. 2. There is a moderate-sized pericardial effusion. There is cardiomegaly and there is coronary artery calcifications. 3. There is a small left pleural effusion and trace right pleural effusion. There is no pneumonia.   Electronically Signed   By:  David  [place]   On: 12/20/2013 13:19      EKG Interpretation   Date/Time:  Wednesday December 20 2013 10:21:59 EDT Ventricular Rate:  121 PR Interval:  145 QRS Duration: 64 QT Interval:  316 QTC Calculation: 448 R Axis:   33 Text Interpretation:  Sinus tachycardia Nonspecific repol abnormality,  diffuse leads Baseline wander in lead(s) V2 V3 V6 Confirmed by Denton Lank  MD,  Arley Garant ([id number]) on 12/20/2013 10:25:07 AM      MDM  Iv ns. Labs. Ecg. Monitor.  Reviewed nursing notes and prior charts for additional history.   Given cp, sob, tachycardia, will get ct angio chest.  Ct results noted above.  Will consult card re admission, further eval, possible echo.          Suzi Roots, MD 12/20/13 [phone]

## 2013-12-20 NOTE — ED Notes (Signed)
Dr. Steinl at bedside 

## 2013-12-20 NOTE — ED Notes (Signed)
Patient to CT.

## 2013-12-20 NOTE — ED Notes (Signed)
Admitting physician at bedside

## 2013-12-20 NOTE — ED Notes (Signed)
Patient returned from ct

## 2013-12-20 NOTE — H&P (Signed)
Cardiologist: Lendell Caprice DAVID, MD  Katrina Meadows is an 80 y.o. female.   Chief Complaint: Chest pain HPI:  The patient is an 80 yo female with no prior cardiac history but with a history of Tobacco abuse 1-1.5 packs per day since age 45, HLD, prediabetes, hypothyroidism, HTN, sacral fracture.  She reports developing CP yesterday which was worse with laying down and inspiration and improved when sitting up.  She also reports SOB, but no orthopnea of PND, cough and abd pain, LEE.  She has had some blood in her underwear on occasion.   The CP she describes as Sharp and squeezing.  No The patient currently denies nausea, vomiting, fever,dizziness, melena.  The patient reports that she is DNR.   Prior to Admission medications   Medication Sig Start Date End Date Taking? Authorizing Provider  Acetaminophen (ARTHRITIS PAIN RELIEF PO) Take 1 tablet by mouth 3 (three) times daily as needed (pain).   Yes Historical Provider, MD  atenolol (TENORMIN) 100 MG tablet Take 100 mg by mouth daily.   Yes Historical Provider, MD  cholecalciferol (VITAMIN D) 1000 UNITS tablet Take 1,000 Units by mouth daily.    Yes Historical Provider, MD  hydrochlorothiazide (MICROZIDE) 12.5 MG capsule Take 12.5 mg by mouth daily.   Yes Historical Provider, MD  levothyroxine (SYNTHROID, LEVOTHROID) 50 MCG tablet Take 50 mcg by mouth daily before breakfast.   Yes Historical Provider, MD  vitamin C (ASCORBIC ACID) 500 MG tablet Take 500 mg by mouth daily.   Yes Historical Provider, MD     Past Medical History  Diagnosis Date  . Mumps   . Hyperlipidemia   . Pre-diabetes   . Thyroid disease     hypothyroid  . Scoliosis   . Sacral fracture 2009  . Tobacco dependence   . Ankle instability     both   . Cerebral palsy   . Allergy   . Hypertension     Past Surgical History  Procedure Laterality Date  . Iliac artery stent Left 2009    with fem/fem bypass  . Abdominal aortic aneurysm repair       Family History  Problem Relation Age of Onset  . Alzheimer's disease Mother   . Stroke Father   . Heart attack Father   . Heart disease Father   . Alzheimer's disease Father   . Depression Daughter   . Breast cancer Maternal Aunt   . Cancer Maternal Aunt   . Colon cancer Maternal Aunt   . Heart disease Paternal Grandfather    Social History:  reports that she has been smoking Cigarettes.  She has a 99 pack-year smoking history. She does not have any smokeless tobacco history on file. She reports that she does not drink alcohol or use illicit drugs.  Allergies:  Allergies  Allergen Reactions  . Ace Inhibitors Other (See Comments)    unknown  . Clindamycin/Lincomycin Other (See Comments)    unsure  . Codeine Other (See Comments)    Could not walk, barely coming to  . Lotrel [Amlodipine Besy-Benazepril Hcl] Other (See Comments)    unknown  . Nickel Other (See Comments)    unknown  . Sulfa Antibiotics Swelling     (Not in a hospital admission)  Results for orders placed during the hospital encounter of 12/20/13 (from the past 48 hour(s))  CBC     Status: Abnormal   Collection Time    12/20/13 10:45 AM  Result Value Ref Range   WBC 9.3  4.0 - 10.5 K/uL   RBC 4.05  3.87 - 5.11 MIL/uL   Hemoglobin 12.4  12.0 - 15.0 g/dL   HCT 37.9  36.0 - 46.0 %   MCV 93.6  78.0 - 100.0 fL   MCH 30.6  26.0 - 34.0 pg   MCHC 32.7  30.0 - 36.0 g/dL   RDW 13.2  11.5 - 15.5 %   Platelets 140 (*) 150 - 400 K/uL  COMPREHENSIVE METABOLIC PANEL     Status: Abnormal   Collection Time    12/20/13 10:45 AM      Result Value Ref Range   Sodium 146  137 - 147 mEq/L   Potassium 3.9  3.7 - 5.3 mEq/L   Chloride 105  96 - 112 mEq/L   CO2 27  19 - 32 mEq/L   Glucose, Bld 124 (*) 70 - 99 mg/dL   BUN 22  6 - 23 mg/dL   Creatinine, Ser 0.71  0.50 - 1.10 mg/dL   Calcium 9.8  8.4 - 10.5 mg/dL   Total Protein 6.5  6.0 - 8.3 g/dL   Albumin 3.4 (*) 3.5 - 5.2 g/dL   AST 14  0 - 37 U/L   ALT 8  0  - 35 U/L   Alkaline Phosphatase 68  39 - 117 U/L   Total Bilirubin 0.3  0.3 - 1.2 mg/dL   GFR calc non Af Amer 78 (*) >90 mL/min   GFR calc Af Amer 90 (*) >90 mL/min   Comment: (NOTE)     The eGFR has been calculated using the CKD EPI equation.     This calculation has not been validated in all clinical situations.     eGFR's persistently <90 mL/min signify possible Chronic Kidney     Disease.   Anion gap 14  5 - 15  LIPASE, BLOOD     Status: None   Collection Time    12/20/13 10:45 AM      Result Value Ref Range   Lipase 44  11 - 59 U/L  TROPONIN I     Status: None   Collection Time    12/20/13 10:45 AM      Result Value Ref Range   Troponin I <0.30  <0.30 ng/mL   Comment:            Due to the release kinetics of cTnI,     a negative result within the first hours     of the onset of symptoms does not rule out     myocardial infarction with certainty.     If myocardial infarction is still suspected,     repeat the test at appropriate intervals.  URINALYSIS, ROUTINE W REFLEX MICROSCOPIC     Status: Abnormal   Collection Time    12/20/13 11:43 AM      Result Value Ref Range   Color, Urine YELLOW  YELLOW   APPearance CLEAR  CLEAR   Specific Gravity, Urine 1.027  1.005 - 1.030   pH 5.0  5.0 - 8.0   Glucose, UA NEGATIVE  NEGATIVE mg/dL   Hgb urine dipstick TRACE (*) NEGATIVE   Bilirubin Urine NEGATIVE  NEGATIVE   Ketones, ur NEGATIVE  NEGATIVE mg/dL   Protein, ur NEGATIVE  NEGATIVE mg/dL   Urobilinogen, UA 0.2  0.0 - 1.0 mg/dL   Nitrite NEGATIVE  NEGATIVE   Leukocytes, UA NEGATIVE  NEGATIVE  URINE MICROSCOPIC-ADD ON  Status: Abnormal   Collection Time    12/20/13 11:43 AM      Result Value Ref Range   Squamous Epithelial / LPF FEW (*) RARE   WBC, UA 3-6  <3 WBC/hpf   RBC / HPF 0-2  <3 RBC/hpf   Bacteria, UA FEW (*) RARE   Casts GRANULAR CAST (*) NEGATIVE   Dg Chest 2 View  12/20/2013   CLINICAL DATA:  Inspiratory chest pain  EXAM: CHEST  2 VIEW  COMPARISON:   Portable chest x-ray of March 07, 2008 and PA and lateral chest x-ray of March 02, 2008.  FINDINGS: The lungs are hyperinflated with hemidiaphragm flattening. There is no focal infiltrate. There is no pneumothorax or pneumomediastinum or pleural effusion. The heart is mildly enlarged. The pulmonary vascularity is not engorged. There is tortuosity of the ascending and descending thoracic aorta. There is degenerative change of both shoulders greater on the right than on the left. There is stable mild compression of the T8 vertebral body.  IMPRESSION: COPD without evidence of pneumonia, pleural effusion, nor pulmonary edema. Stable mild cardiomegaly.   Electronically Signed   By: David  [place]   On: 12/20/2013 11:08   Ct Angio Chest Pe W/cm &/or Wo Cm  12/20/2013   CLINICAL DATA:  Chest pain  EXAM: CT ANGIOGRAPHY CHEST WITH CONTRAST  TECHNIQUE: Multidetector CT imaging of the chest was performed using the standard protocol during bolus administration of intravenous contrast. Multiplanar CT image reconstructions and MIPs were obtained to evaluate the vascular anatomy.  CONTRAST:  19m OMNIPAQUE IOHEXOL 350 MG/ML SOLN intravenously  COMPARISON:  PA and lateral chest of December 20, 2013  FINDINGS: Contrast within the pulmonary arterial tree is normal. There are no filling defects to suggest an acute pulmonary embolism. The caliber of the thoracic aorta is normal. There is considerable mural thrombus an ulcerated plaque. There is no false lumen. There is a moderate-sized pericardial effusion. The cardiac chambers are enlarged. There are coronary artery calcifications. There is no mediastinal or hilar lymphadenopathy. The thoracic esophagus exhibits normal caliber. There is a small left pleural effusion and trace right pleural effusion.  There are emphysematous changes in the lungs. There is compressive atelectasis posteriorly at the bases. There is pleural thickening along the lateral aspect of the lingula. There  is no alveolar pneumonia.  Within the upper abdomen the observed portions of the liver and spleen are normal.  There is mild wedge compression of the body of T8 slightly more prominent than on a lateral chest x-ray of March 02, 2008. The sternum is intact. The observed ribs exhibit no acute abnormalities.  Review of the MIP images confirms the above findings.  IMPRESSION: 1. There is no acute pulmonary embolism nor evidence of thoracic aortic dissection or aneurysm. There is extensive mural thrombus and ulcerated plaque throughout the thoracic aorta. 2. There is a moderate-sized pericardial effusion. There is cardiomegaly and there is coronary artery calcifications. 3. There is a small left pleural effusion and trace right pleural effusion. There is no pneumonia.   Electronically Signed   By: David  [place]  On: 12/20/2013 13:19    Review of Systems  Constitutional: Negative for fever and diaphoresis.  HENT: Negative for sore throat.   Respiratory: Positive for cough and shortness of breath.   Cardiovascular: Positive for chest pain and leg swelling. Negative for orthopnea and PND.  Gastrointestinal: Positive for abdominal pain and blood in stool. Negative for nausea, vomiting and melena.  Genitourinary: Negative for  hematuria.  Neurological: Negative for dizziness.  All other systems reviewed and are negative.   Blood pressure 165/87, pulse 106, temperature 97.6 F (36.4 C), temperature source Oral, resp. rate 25, height '5\' 4"'  (1.626 m), weight 115 lb (52.164 kg), SpO2 93.00%. Physical Exam  Nursing note and vitals reviewed. Constitutional: She is oriented to person, place, and time. She appears well-developed. No distress.  thin  HENT:  Head: Normocephalic and atraumatic.  Mouth/Throat: Oropharynx is clear and moist.  Eyes: EOM are normal. Pupils are equal, round, and reactive to light. No scleral icterus.  Neck: Normal range of motion. Neck supple. JVD present.  Cardiovascular:  Regular rhythm, S1 normal and S2 normal.  Tachycardia present.   No murmur heard. Pulses:      Radial pulses are 2+ on the right side, and 2+ on the left side.       Dorsalis pedis pulses are 1+ on the right side, and 1+ on the left side.  No Carotid Bruit  Respiratory: Effort normal. She has no wheezes. She has no rales.  Decreased BS with rhonchi bilaterally  GI: Soft. Bowel sounds are normal. She exhibits distension. There is no tenderness.  Musculoskeletal: She exhibits edema.  2+ LEE   Neurological: She is alert and oriented to person, place, and time. She exhibits normal muscle tone.  Skin: Skin is warm and dry.  Psychiatric: She has a normal mood and affect.     Assessment/Plan Principal Problem:   Chest pain Active Problems:   Unspecified essential hypertension   Pericardial effusion   Mixed hyperlipidemia   Unspecified hypothyroidism   Abdominal pain   Aortic mural thrombus  Plan:   The patient was started on IV nitroglycerin to help with blood pressure.  We will admit her to stepdown.  Her CXR does not show CHF, however, with her JVD being elevated, and COPD, she probably has pulmonary hypertension.  Will start IV lasix and check 2D echo with contrast.  Continue beta blocker with bystolic given COPD.  Will eventually change NTG to an ACE-I if SCr remains stable.   She is not a candidate for long term anti-coagulation.  She clearly has CAD and a lot of calcification of the aorta and coronaries.  She is not a coronary cath candidate and medical management is probably the best course of action.  She may have pericarditis given positional nature of pain and effusion.  No EKG changes to suggest that.  Will add motrin. We discussed DNR status and she stated it is written down at home.   Tarri Fuller 12/20/2013, 3:36 PM    Agree with note written by Luisa Dago [facility]  Elderly frail female with long H/O tobacco, COPD, DNR now with 1 week of pleuritic CP. CT of chest shows no PE  but moderate pericardial effusion. EKG shows no acute changes. Enz neg. Exam notable for elevated neck veins, pericardial knock, 1-2+ pitting edema. Will admit, check 2D, diurese. Will add motrin for presumed pericarditis. Change Atenolol to bystolic for BP.  Lorretta Harp 12/20/2013 3:54 PM

## 2013-12-21 DIAGNOSIS — F17200 Nicotine dependence, unspecified, uncomplicated: Secondary | ICD-10-CM | POA: Diagnosis present

## 2013-12-21 DIAGNOSIS — Z66 Do not resuscitate: Secondary | ICD-10-CM | POA: Diagnosis present

## 2013-12-21 DIAGNOSIS — E782 Mixed hyperlipidemia: Secondary | ICD-10-CM | POA: Diagnosis present

## 2013-12-21 DIAGNOSIS — I509 Heart failure, unspecified: Secondary | ICD-10-CM | POA: Diagnosis present

## 2013-12-21 DIAGNOSIS — I2510 Atherosclerotic heart disease of native coronary artery without angina pectoris: Secondary | ICD-10-CM | POA: Diagnosis present

## 2013-12-21 DIAGNOSIS — I517 Cardiomegaly: Secondary | ICD-10-CM

## 2013-12-21 DIAGNOSIS — R079 Chest pain, unspecified: Secondary | ICD-10-CM | POA: Diagnosis present

## 2013-12-21 DIAGNOSIS — G809 Cerebral palsy, unspecified: Secondary | ICD-10-CM | POA: Diagnosis present

## 2013-12-21 DIAGNOSIS — I319 Disease of pericardium, unspecified: Secondary | ICD-10-CM | POA: Diagnosis present

## 2013-12-21 DIAGNOSIS — Z79899 Other long term (current) drug therapy: Secondary | ICD-10-CM | POA: Diagnosis not present

## 2013-12-21 DIAGNOSIS — R0789 Other chest pain: Secondary | ICD-10-CM | POA: Diagnosis present

## 2013-12-21 DIAGNOSIS — E039 Hypothyroidism, unspecified: Secondary | ICD-10-CM | POA: Diagnosis present

## 2013-12-21 DIAGNOSIS — I5189 Other ill-defined heart diseases: Secondary | ICD-10-CM | POA: Diagnosis present

## 2013-12-21 DIAGNOSIS — J449 Chronic obstructive pulmonary disease, unspecified: Secondary | ICD-10-CM | POA: Diagnosis present

## 2013-12-21 DIAGNOSIS — I700 Atherosclerosis of aorta: Secondary | ICD-10-CM | POA: Diagnosis present

## 2013-12-21 DIAGNOSIS — I10 Essential (primary) hypertension: Secondary | ICD-10-CM | POA: Diagnosis present

## 2013-12-21 LAB — BASIC METABOLIC PANEL
Anion gap: 14 (ref 5–15)
BUN: 14 mg/dL (ref 6–23)
CALCIUM: 8.7 mg/dL (ref 8.4–10.5)
CHLORIDE: 102 meq/L (ref 96–112)
CO2: 29 meq/L (ref 19–32)
Creatinine, Ser: 0.7 mg/dL (ref 0.50–1.10)
GFR calc Af Amer: >90 mL/min (ref >90)
GFR calc non Af Amer: 78 mL/min — ABNORMAL LOW (ref >90)
GLUCOSE: 108 mg/dL — AB (ref 70–99)
POTASSIUM: 3.9 meq/L (ref 3.7–5.3)
SODIUM: 145 meq/L (ref 137–147)

## 2013-12-21 LAB — LIPID PANEL
Cholesterol: 139 mg/dL (ref 0–200)
HDL: 74 mg/dL (ref >39)
LDL Cholesterol: 42 mg/dL (ref 0–99)
Total CHOL/HDL Ratio: 1.9 RATIO
Triglycerides: 115 mg/dL (ref <150)
VLDL: 23 mg/dL (ref 0–40)

## 2013-12-21 LAB — TROPONIN I
Troponin I: <0.3 ng/mL (ref <0.30)
Troponin I: <0.3 ng/mL (ref <0.30)

## 2013-12-21 MED ORDER — ENOXAPARIN SODIUM 40 MG/0.4ML ~~LOC~~ SOLN
40.0000 mg | SUBCUTANEOUS | Status: DC
  Administered 2013-12-21 – 2013-12-22 (×2): 40 mg via SUBCUTANEOUS
  Filled 2013-12-21 (×3): qty 0.4

## 2013-12-21 MED ORDER — NEBIVOLOL HCL 10 MG PO TABS
10.0000 mg | ORAL_TABLET | Freq: Every day | ORAL | Status: DC
  Administered 2013-12-22 – 2013-12-23 (×2): 10 mg via ORAL
  Filled 2013-12-21 (×2): qty 1

## 2013-12-21 MED ORDER — HYDRALAZINE HCL 10 MG PO TABS
10.0000 mg | ORAL_TABLET | Freq: Three times a day (TID) | ORAL | Status: DC
  Administered 2013-12-21 – 2013-12-23 (×4): 10 mg via ORAL
  Filled 2013-12-21 (×10): qty 1

## 2013-12-21 NOTE — Progress Notes (Addendum)
Patient ID: Hassell Done, female   DOB: 1928/07/04, 80 y.o.   MRN: [id number]    Subjective:  Mild pain with inspiration   Objective:  Filed Vitals:   12/21/13 0700 12/21/13 0800 12/21/13 0900 12/21/13 1000  BP: 156/72  145/63   Pulse: 88 100 90 100  Temp: 97.4 F (36.3 C)     TempSrc: Oral     Resp: _0 Height:      Weight:      SpO2: 95% 94% 96% 92%    Intake/Output from previous day:  Intake/Output Summary (Last 24 hours) at 12/21/13 1134 Last data filed at 12/21/13 1000  Gross per 24 hour  Intake    356 ml  Output    903 ml  Net   -547 ml    Physical Exam: Affect appropriate Frail kyphotic female  HEENT: normal Neck supple with no adenopathy JVP normal no bruits no thyromegaly Lungs clear with no wheezing and good diaphragmatic motion Heart:  S1/S2 no murmur, no rub, gallop or click PMI normal Abdomen: benighn, BS positve, no tenderness, no AAA no bruit.  No HSM or HJR Distal pulses intact with no bruits No edema Neuro non-focal Skin warm and dry No muscular weakness   Lab Results: Basic Metabolic Panel:  Recent Labs  12/20/13 1045 12/21/13 0314  NA 146 145  K 3.9 3.9  CL 105 102  CO2 27 29  GLUCOSE 124* 108*  BUN 22 14  CREATININE 0.71 0.70  CALCIUM 9.8 8.7   Liver Function Tests:  Recent Labs  12/20/13 1045  AST 14  ALT 8  ALKPHOS 68  BILITOT 0.3  PROT 6.5  ALBUMIN 3.4*    Recent Labs  12/20/13 1045  LIPASE 44   CBC:  Recent Labs  12/20/13 1045  WBC 9.3  HGB 12.4  HCT 37.9  MCV 93.6  PLT 140*   Cardiac Enzymes:  Recent Labs  12/20/13 1900 12/20/13 2340 12/21/13 0314  TROPONINI <0.30 <0.30 <0.30   Fasting Lipid Panel:  Recent Labs  12/21/13 0314  CHOL 139  HDL 74  LDLCALC 42  TRIG 115  CHOLHDL 1.9   Thyroid Function Tests:  Recent Labs  12/20/13 1900  TSH 1.320    Imaging: Dg Chest 2 View  12/20/2013   CLINICAL DATA:  Inspiratory chest pain  EXAM: CHEST  2 VIEW  COMPARISON:   Portable chest x-ray of March 07, 2008 and PA and lateral chest x-ray of March 02, 2008.  FINDINGS: The lungs are hyperinflated with hemidiaphragm flattening. There is no focal infiltrate. There is no pneumothorax or pneumomediastinum or pleural effusion. The heart is mildly enlarged. The pulmonary vascularity is not engorged. There is tortuosity of the ascending and descending thoracic aorta. There is degenerative change of both shoulders greater on the right than on the left. There is stable mild compression of the T8 vertebral body.  IMPRESSION: COPD without evidence of pneumonia, pleural effusion, nor pulmonary edema. Stable mild cardiomegaly.   Electronically Signed   By: David  [place]   On: 12/20/2013 11:08   Ct Angio Chest Pe W/cm &/or Wo Cm  12/20/2013   CLINICAL DATA:  Chest pain  EXAM: CT ANGIOGRAPHY CHEST WITH CONTRAST  TECHNIQUE: Multidetector CT imaging of the chest was performed using the standard protocol during bolus administration of intravenous contrast. Multiplanar CT image reconstructions and MIPs were obtained to evaluate the vascular anatomy.  CONTRAST:  131m OMNIPAQUE IOHEXOL 350 MG/ML SOLN intravenously  COMPARISON:  PA and lateral chest of December 20, 2013  FINDINGS: Contrast within the pulmonary arterial tree is normal. There are no filling defects to suggest an acute pulmonary embolism. The caliber of the thoracic aorta is normal. There is considerable mural thrombus an ulcerated plaque. There is no false lumen. There is a moderate-sized pericardial effusion. The cardiac chambers are enlarged. There are coronary artery calcifications. There is no mediastinal or hilar lymphadenopathy. The thoracic esophagus exhibits normal caliber. There is a small left pleural effusion and trace right pleural effusion.  There are emphysematous changes in the lungs. There is compressive atelectasis posteriorly at the bases. There is pleural thickening along the lateral aspect of the lingula. There  is no alveolar pneumonia.  Within the upper abdomen the observed portions of the liver and spleen are normal.  There is mild wedge compression of the body of T8 slightly more prominent than on a lateral chest x-ray of March 02, 2008. The sternum is intact. The observed ribs exhibit no acute abnormalities.  Review of the MIP images confirms the above findings.  IMPRESSION: 1. There is no acute pulmonary embolism nor evidence of thoracic aortic dissection or aneurysm. There is extensive mural thrombus and ulcerated plaque throughout the thoracic aorta. 2. There is a moderate-sized pericardial effusion. There is cardiomegaly and there is coronary artery calcifications. 3. There is a small left pleural effusion and trace right pleural effusion. There is no pneumonia.   Electronically Signed   By: David  [place]   On: 12/20/2013 13:19    Cardiac Studies:  ECG:  SR PAC no acute ischemic changes or signs of pericarditis   Telemetry:  NSR PAC no PAF  Echo:  Pending   Medications:   . aspirin EC  81 mg Oral Daily  . atorvastatin  20 mg Oral q1800  . cholecalciferol  1,000 Units Oral Daily  . furosemide  20 mg Intravenous BID  . heparin  5,000 Units Subcutaneous 3 times per day  . levothyroxine  50 mcg Oral QAC breakfast  . nebivolol  5 mg Oral Daily  . vitamin C  500 mg Oral Daily     . nitroGLYCERIN 15 mcg/min (12/21/13 1000)    Assessment/Plan:  HTN:  Improved wean iv nitro  Increase beta blocker allergic to ACE and norvasc add hydralazine  Chest pain:  Pleuritic CXR and CT ok R.O not cardiac  Pericardial effusion by CT  Echo pending NSAI's  Check ESR  TSH normal 1.3 Chol:  On statin  COPD:  Stable long time smoker  CHF:  ?  On too much lasix  BNP only in 300's  Echo pending d/c iv lasix for now   Jenkins Rouge 12/21/2013, 11:34 AM

## 2013-12-21 NOTE — Progress Notes (Signed)
Pharmacy Note  80 yo female here with CP/HTN and pharmacy has been asked to dose lovenox for VTE prophylaxis. Hg= 12.4/37.9, plt= 140, SCr= 0.7 and CrCl ~ 40.    Plan -lovenox 40mg  [place] q24hr -CBC q 3 days -Pharmacy will sign off. Please call for any other needs  Thank you, Harland Germanndrew Kaileia Flow, Pharm D 12/21/2013 12:22 PM

## 2013-12-21 NOTE — Progress Notes (Signed)
  Echocardiogram 2D Echocardiogram has been performed.  [name], Katrina Meadows 12/21/2013, 12:18 PM

## 2013-12-21 NOTE — Progress Notes (Signed)
Utilization review completed.  

## 2013-12-22 NOTE — Progress Notes (Signed)
Patient arrived to unit per wheelchair accompanied by 2H CNA.  Patient alert, oriented, verbally responsive, breathing regular and non-labored throughout, no s/s of distress noted throughout, no c/o pain throughout.  Patient and daughter oriented to unit and room, needs attended to.  VS WNL.  Will continue to monitor.  Reita ClicheWilliams,Debroah Shuttleworth 12/22/2013 3:24 PM

## 2013-12-22 NOTE — Progress Notes (Signed)
Patient ID: Katrina Meadows, female   DOB: [birth year], 80 y.o.   MRN: [id number]    Subjective:  No more pleuritic pain   Objective:  Filed Vitals:   12/22/13 0400 12/22/13 0423 12/22/13 0433 12/22/13 0753  BP: 121/64   90/45  Pulse: 73   70  Temp:  97.9 F (36.6 C) 97.9 F (36.6 C) 98 F (36.7 C)  TempSrc:  Oral Oral Oral  Resp:      Height:      Weight:   101 lb 10.1 oz (46.1 kg)   SpO2: 99%   94%    Intake/Output from previous day:  Intake/Output Summary (Last 24 hours) at 12/22/13 4098 Last data filed at 12/22/13 0434  Gross per 24 hour  Intake  323.5 ml  Output   1528 ml  Net -1204.5 ml    Physical Exam: Affect appropriate Frail kyphotic female  HEENT: normal Neck supple with no adenopathy JVP normal no bruits no thyromegaly Lungs clear with no wheezing and good diaphragmatic motion Heart:  S1/S2 no murmur, no rub, gallop or click PMI normal Abdomen: benighn, BS positve, no tenderness, no AAA no bruit.  No HSM or HJR Distal pulses intact with no bruits No edema Neuro non-focal Skin warm and dry No muscular weakness   Lab Results: Basic Metabolic Panel:  Recent Labs  11/91/47 1045 12/21/13 0314  NA 146 145  K 3.9 3.9  CL 105 102  CO2 27 29  GLUCOSE 124* 108*  BUN 22 14  CREATININE 0.71 0.70  CALCIUM 9.8 8.7   Liver Function Tests:  Recent Labs  12/20/13 1045  AST 14  ALT 8  ALKPHOS 68  BILITOT 0.3  PROT 6.5  ALBUMIN 3.4*    Recent Labs  12/20/13 1045  LIPASE 44   CBC:  Recent Labs  12/20/13 1045  WBC 9.3  HGB 12.4  HCT 37.9  MCV 93.6  PLT 140*   Cardiac Enzymes:  Recent Labs  12/20/13 1900 12/20/13 2340 12/21/13 0314  TROPONINI <0.30 <0.30 <0.30   Fasting Lipid Panel:  Recent Labs  12/21/13 0314  CHOL 139  HDL 74  LDLCALC 42  TRIG 115  CHOLHDL 1.9   Thyroid Function Tests:  Recent Labs  12/20/13 1900  TSH 1.320    Imaging: Dg Chest 2 View  12/20/2013   CLINICAL DATA:  Inspiratory chest  pain  EXAM: CHEST  2 VIEW  COMPARISON:  Portable chest x-ray of March 07, 2008 and PA and lateral chest x-ray of March 02, 2008.  FINDINGS: The lungs are hyperinflated with hemidiaphragm flattening. There is no focal infiltrate. There is no pneumothorax or pneumomediastinum or pleural effusion. The heart is mildly enlarged. The pulmonary vascularity is not engorged. There is tortuosity of the ascending and descending thoracic aorta. There is degenerative change of both shoulders greater on the right than on the left. There is stable mild compression of the T8 vertebral body.  IMPRESSION: COPD without evidence of pneumonia, pleural effusion, nor pulmonary edema. Stable mild cardiomegaly.   Electronically Signed   By: David  [place]   On: 12/20/2013 11:08   Ct Angio Chest Pe W/cm &/or Wo Cm  12/20/2013   CLINICAL DATA:  Chest pain  EXAM: CT ANGIOGRAPHY CHEST WITH CONTRAST  TECHNIQUE: Multidetector CT imaging of the chest was performed using the standard protocol during bolus administration of intravenous contrast. Multiplanar CT image reconstructions and MIPs were obtained to evaluate the vascular anatomy.  CONTRAST:  OMNIPAQUE  IOHEXOL 350 MG/ML SOLN intravenously  COMPARISON:  PA and lateral chest of December 20, 2013  FINDINGS: Contrast within the pulmonary arterial tree is normal. There are no filling defects to suggest an acute pulmonary embolism. The caliber of the thoracic aorta is normal. There is considerable mural thrombus an ulcerated plaque. There is no false lumen. There is a moderate-sized pericardial effusion. The cardiac chambers are enlarged. There are coronary artery calcifications. There is no mediastinal or hilar lymphadenopathy. The thoracic esophagus exhibits normal caliber. There is a small left pleural effusion and trace right pleural effusion.  There are emphysematous changes in the lungs. There is compressive atelectasis posteriorly at the bases. There is pleural thickening along  the lateral aspect of the lingula. There is no alveolar pneumonia.  Within the upper abdomen the observed portions of the liver and spleen are normal.  There is mild wedge compression of the body of T8 slightly more prominent than on a lateral chest x-ray of March 02, 2008. The sternum is intact. The observed ribs exhibit no acute abnormalities.  Review of the MIP images confirms the above findings.  IMPRESSION: 1. There is no acute pulmonary embolism nor evidence of thoracic aortic dissection or aneurysm. There is extensive mural thrombus and ulcerated plaque throughout the thoracic aorta. 2. There is a moderate-sized pericardial effusion. There is cardiomegaly and there is coronary artery calcifications. 3. There is a small left pleural effusion and trace right pleural effusion. There is no pneumonia.   Electronically Signed   By: David  [place]   On: 12/20/2013 13:19    Cardiac Studies:  ECG:  SR PAC no acute ischemic changes or signs of pericarditis   Telemetry:  NSR PAC no PAF  Echo:  EF 60-65%  Trivial pericardial effusion  Medications:   . aspirin EC  81 mg Oral Daily  . atorvastatin  20 mg Oral q1800  . cholecalciferol  1,000 Units Oral Daily  . enoxaparin (LOVENOX) injection  40 mg Subcutaneous Q24H  . hydrALAZINE  10 mg Oral 3 times per day  . levothyroxine  50 mcg Oral QAC breakfast  . nebivolol  10 mg Oral Daily  . vitamin C  500 mg Oral Daily     . nitroGLYCERIN Stopped (12/21/13 1230)    Assessment/Plan:  HTN:  Improved wean iv nitro  Increase beta blocker allergic to ACE and norvasc add hydralazine  Chest pain:  Pleuritic CXR and CT ok R.O not cardiac  Pericardial effusion by CT  Echo  Only trivial with normal EF  NSAI's    TSH normal 1.3 Chol:  On statin  COPD:  Stable long time smoker  CHF:  ?  On too much lasix  BNP only in 300's  Echo normal EF  d/c iv lasix for now   Transfer to floor d/c am   Katrina Meadows 12/22/2013, 9:22 AM

## 2013-12-22 NOTE — Progress Notes (Signed)
[facility]  Awaiting patients transfer to [facility].    Reita ClicheWilliams,Prateek Knipple 12/22/2013 3:04 PM

## 2013-12-23 DIAGNOSIS — I10 Essential (primary) hypertension: Secondary | ICD-10-CM

## 2013-12-23 MED ORDER — NEBIVOLOL HCL 10 MG PO TABS: 10.0000 mg | ORAL_TABLET | Freq: Every day | ORAL | Status: DC

## 2013-12-23 MED ORDER — HYDRALAZINE HCL 10 MG PO TABS: 10.0000 mg | ORAL_TABLET | Freq: Three times a day (TID) | ORAL | Status: DC

## 2013-12-23 MED ORDER — NITROGLYCERIN 0.4 MG SL SUBL: 0.4000 mg | SUBLINGUAL_TABLET | SUBLINGUAL | Status: DC | PRN

## 2013-12-23 MED ORDER — ASPIRIN 81 MG PO TBEC: 81.0000 mg | DELAYED_RELEASE_TABLET | Freq: Every day | ORAL | Status: DC

## 2013-12-23 MED ORDER — ATORVASTATIN CALCIUM 20 MG PO TABS: 20.0000 mg | ORAL_TABLET | Freq: Every day | ORAL | Status: DC

## 2013-12-23 MED ORDER — IBUPROFEN 200 MG PO TABS: 200.0000 mg | ORAL_TABLET | Freq: Three times a day (TID) | ORAL | Status: DC | PRN

## 2013-12-23 NOTE — Discharge Summary (Signed)
Patient ID: Katrina Meadows,  MRN: [id number], DOB/AGE: Jan 27, 1929 80 y.o.  Admit date: 12/20/2013 Discharge date: 12/23/2013  Primary Care Provider: Nadean CorwinMCKEOWN,WILLIAM DAVID, MD  Primary Cardiologist: Dr Allyson SabalBerry  Discharge Diagnoses Principal Problem:   Chest pain with moderate risk of acute coronary syndrome Active Problems:   Unspecified essential hypertension   Mixed hyperlipidemia   Unspecified hypothyroidism   Pericardial effusion on CT, trivial on echo   Aortic mural thrombus    Hospital Course:  80 yo female with no prior cardiac history but with a history of tobacco abuse 1-1.5 packs per day since age 80, HLD, prediabetes, hypothyroidism, HTN, presented 12/20/13 with chest pain. CT done in the ER revealed extensive mural thrombus and ulcerated plaque throughout the thoracic aorta as well as a moderate-sized pericardial effusion, cardiomegaly and coronary artery calcifications. The pt was admitted and ruled out for PE and MI. Echo done showed normal LVF with trivial pericardial effusion. It was felt the pt's chest pain was probably non cardiac. Her medications were adjusted for HTN, her B/P was elevated on admission. She was taken off her diuretic. Dr Mayford Knifeurner saw her on the morning of the th 18th and feels she can be discharge. An OP Myoview will be considered if she has recurrent chest pain.      Discharge Vitals:  Blood pressure 114/52, pulse 62, temperature 97.9 F (36.6 C), temperature source Oral, resp. rate 16, height 5\' 4"  (1.626 m), weight 94 lb (42.638 kg), SpO2 94.00%.    Labs: No results found for this or any previous visit (from the past 24 hour(s)).  Disposition:  Follow-up Information   Follow up with Runell GessBERRY,JONATHAN J, MD. (office will call you)    Specialty:  Cardiology   Contact information:   [address] [place] [id number] [phone]       Discharge Medications:    Medication List    STOP taking these medications       atenolol 100 MG tablet  Commonly known as:  TENORMIN     hydrochlorothiazide 12.5 MG capsule  Commonly known as:  MICROZIDE      TAKE these medications       ARTHRITIS PAIN RELIEF PO  Take 1 tablet by mouth 3 (three) times daily as needed (pain).     aspirin 81 MG EC tablet  Take 1 tablet (81 mg total) by mouth daily.     atorvastatin 20 MG tablet  Commonly known as:  LIPITOR  Take 1 tablet (20 mg total) by mouth daily at 6 PM.     cholecalciferol 1000 UNITS tablet  Commonly known as:  VITAMIN D  Take 1,000 Units by mouth daily.     hydrALAZINE 10 MG tablet  Commonly known as:  APRESOLINE  Take 1 tablet (10 mg total) by mouth every 8 (eight) hours.     ibuprofen 200 MG tablet  Commonly known as:  ADVIL  Take 1 tablet (200 mg total) by mouth every 8 (eight) hours as needed for mild pain.     levothyroxine 50 MCG tablet  Commonly known as:  SYNTHROID, LEVOTHROID  Take 50 mcg by mouth daily before breakfast.     nebivolol 10 MG tablet  Commonly known as:  BYSTOLIC  Take 1 tablet (10 mg total) by mouth daily.     nitroGLYCERIN 0.4 MG SL tablet  Commonly known as:  NITROSTAT  Place 1 tablet (0.4 mg total) under the tongue every 5 (five) minutes as  needed for chest pain (severe chest pressure or tightness).     vitamin C 500 MG tablet  Commonly known as:  ASCORBIC ACID  Take 500 mg by mouth daily.         Duration of Discharge Encounter: Greater than 30 minutes including physician time.  Jolene Provost PA-C 12/23/2013 9:34 AM

## 2013-12-23 NOTE — Progress Notes (Signed)
SUBJECTIVE:  No further CP except with rare deep breathing  OBJECTIVE:   Vitals:   Filed Vitals:   12/22/13 1139 12/22/13 1518 12/22/13 2152 12/23/13 0558  BP: 120/47 106/62 149/60 114/52  Pulse: 67 60 69 62  Temp: 98.2 F (36.8 C) 97.8 F (36.6 C) 98.4 F (36.9 C) 97.9 F (36.6 C)  TempSrc: Oral     Resp: 18  18 16   Height:      Weight:    94 lb (42.638 kg)  SpO2: 98% 94% 91% 94%   I&O's:   Intake/Output Summary (Last 24 hours) at 12/23/13 0824 Last data filed at 12/23/13 [id number]  Gross per 24 hour  Intake     60 ml  Output    926 ml  Net   -866 ml     PHYSICAL EXAM General: Well developed, well nourished, in no acute distress Head: Eyes PERRLA, No xanthomas.   Normal cephalic and atramatic  Lungs:   Clear bilaterally to auscultation and percussion. Heart:   HRRR S1 S2 Pulses are 2+ & equal. Abdomen: Bowel sounds are positive, abdomen soft and non-tender without masses  Extremities:   No clubbing, cyanosis or edema.  DP +1 Neuro: Alert and oriented X 3. Psych:  Good affect, responds appropriately   LABS: Basic Metabolic Panel:  Recent Labs  30/86/5707/15/15 1045 12/21/13 0314  NA 146 145  K 3.9 3.9  CL 105 102  CO2 27 29  GLUCOSE 124* 108*  BUN 22 14  CREATININE 0.71 0.70  CALCIUM 9.8 8.7   Liver Function Tests:  Recent Labs  12/20/13 1045  AST 14  ALT 8  ALKPHOS 68  BILITOT 0.3  PROT 6.5  ALBUMIN 3.4*    Recent Labs  12/20/13 1045  LIPASE 44   CBC:  Recent Labs  12/20/13 1045  WBC 9.3  HGB 12.4  HCT 37.9  MCV 93.6  PLT 140*   Cardiac Enzymes:  Recent Labs  12/20/13 1900 12/20/13 2340 12/21/13 0314  TROPONINI <0.30 <0.30 <0.30   BNP: No components found with this basename: POCBNP,  D-Dimer: No results found for this basename: DDIMER,  in the last 72 hours Hemoglobin A1C: No results found for this basename: HGBA1C,  in the last 72 hours Fasting Lipid Panel:  Recent Labs  12/21/13 0314  CHOL 139  HDL 74  LDLCALC 42    TRIG 115  CHOLHDL 1.9   Thyroid Function Tests:  Recent Labs  12/20/13 1900  TSH 1.320   Anemia Panel: No results found for this basename: VITAMINB12, FOLATE, FERRITIN, TIBC, IRON, RETICCTPCT,  in the last 72 hours Coag Panel:   Lab Results  Component Value Date   INR 1.3 03/06/2008   INR 1.0 03/02/2008    RADIOLOGY: Dg Chest 2 View  12/20/2013   CLINICAL DATA:  Inspiratory chest pain  EXAM: CHEST  2 VIEW  COMPARISON:  Portable chest x-ray of March 07, 2008 and PA and lateral chest x-ray of March 02, 2008.  FINDINGS: The lungs are hyperinflated with hemidiaphragm flattening. There is no focal infiltrate. There is no pneumothorax or pneumomediastinum or pleural effusion. The heart is mildly enlarged. The pulmonary vascularity is not engorged. There is tortuosity of the ascending and descending thoracic aorta. There is degenerative change of both shoulders greater on the right than on the left. There is stable mild compression of the T8 vertebral body.  IMPRESSION: COPD without evidence of pneumonia, pleural effusion, nor pulmonary edema. Stable mild cardiomegaly.  Electronically Signed   By: David  [place]   On: 12/20/2013 11:08   Ct Angio Chest Pe W/cm &/or Wo Cm  12/20/2013   CLINICAL DATA:  Chest pain  EXAM: CT ANGIOGRAPHY CHEST WITH CONTRAST  TECHNIQUE: Multidetector CT imaging of the chest was performed using the standard protocol during bolus administration of intravenous contrast. Multiplanar CT image reconstructions and MIPs were obtained to evaluate the vascular anatomy.  CONTRAST:  OMNIPAQUE IOHEXOL 350 MG/ML SOLN intravenously  COMPARISON:  PA and lateral chest of December 20, 2013  FINDINGS: Contrast within the pulmonary arterial tree is normal. There are no filling defects to suggest an acute pulmonary embolism. The caliber of the thoracic aorta is normal. There is considerable mural thrombus an ulcerated plaque. There is no false lumen. There is a moderate-sized  pericardial effusion. The cardiac chambers are enlarged. There are coronary artery calcifications. There is no mediastinal or hilar lymphadenopathy. The thoracic esophagus exhibits normal caliber. There is a small left pleural effusion and trace right pleural effusion.  There are emphysematous changes in the lungs. There is compressive atelectasis posteriorly at the bases. There is pleural thickening along the lateral aspect of the lingula. There is no alveolar pneumonia.  Within the upper abdomen the observed portions of the liver and spleen are normal.  There is mild wedge compression of the body of T8 slightly more prominent than on a lateral chest x-ray of March 02, 2008. The sternum is intact. The observed ribs exhibit no acute abnormalities.  Review of the MIP images confirms the above findings.  IMPRESSION: 1. There is no acute pulmonary embolism nor evidence of thoracic aortic dissection or aneurysm. There is extensive mural thrombus and ulcerated plaque throughout the thoracic aorta. 2. There is a moderate-sized pericardial effusion. There is cardiomegaly and there is coronary artery calcifications. 3. There is a small left pleural effusion and trace right pleural effusion. There is no pneumonia.   Electronically Signed   By: David  [place]   On: 12/20/2013 13:19   Assessment/Plan:  HTN: controlled on beta blocker/Hydralazine - she is allergic to ACE and norvasc  Chest pain: Pleuritic- CXR and CT ok.  Cardiac enzymes negative - not cardiac. Pericardial effusion by CT Echo is only trivial with normal EF- treat with PRN NSAID's. TSH normal 1.3 Chest CT with coronary artery calcifications.  Her CP is atypical and unlikely cardiac but should consider outpt nuclear stress test to rule out ischemia. Chol: On statin  COPD: Stable long time smoker  CHF:EF normal.  Now off Lasix  Atherosclerosis of the thoracic aorta with mural thrombus and ulcerated plaque - will discuss with CVTS any further management  other than ASA.  Continue statin/ASA  D/C home today with followup with Dr. Rosamaria Lints, MD  12/23/2013  8:24 AM

## 2013-12-23 NOTE — Discharge Summary (Signed)
Agree with discharge summary as outlined by Corine ShelterLuke Kilroy PA-C

## 2013-12-23 NOTE — Discharge Instructions (Signed)
Hypertension °Hypertension, commonly called high blood pressure, is when the force of blood pumping through your arteries is too strong. Your arteries are the blood vessels that carry blood from your heart throughout your body. A blood pressure reading consists of a higher number over a lower number, such as 110/72. The higher number (systolic) is the pressure inside your arteries when your heart pumps. The lower number (diastolic) is the pressure inside your arteries when your heart relaxes. Ideally you want your blood pressure below 120/80. °Hypertension forces your heart to work harder to pump blood. Your arteries may become narrow or stiff. Having hypertension puts you at risk for heart disease, stroke, and other problems.  °RISK FACTORS °Some risk factors for high blood pressure are controllable. Others are not.  °Risk factors you cannot control include:  °· Race. You may be at higher risk if you are African American. °· Age. Risk increases with age. °· Gender. Men are at higher risk than women before age 45 years. After age 65, women are at higher risk than men. °Risk factors you can control include: °· Not getting enough exercise or physical activity. °· Being overweight. °· Getting too much fat, sugar, calories, or salt in your diet. °· Drinking too much alcohol. °SIGNS AND SYMPTOMS °Hypertension does not usually cause signs or symptoms. Extremely high blood pressure (hypertensive crisis) may cause headache, anxiety, shortness of breath, and nosebleed. °DIAGNOSIS  °To check if you have hypertension, your health care provider will measure your blood pressure while you are seated, with your arm held at the level of your heart. It should be measured at least twice using the same arm. Certain conditions can cause a difference in blood pressure between your right and left arms. A blood pressure reading that is higher than normal on one occasion does not mean that you need treatment. If one blood pressure reading  is high, ask your health care provider about having it checked again. °TREATMENT  °Treating high blood pressure includes making lifestyle changes and possibly taking medication. Living a healthy lifestyle can help lower high blood pressure. You may need to change some of your habits. °Lifestyle changes may include: °· Following the DASH diet. This diet is high in fruits, vegetables, and whole grains. It is low in salt, red meat, and added sugars. °· Getting at least 2 1/2 hours of brisk physical activity every week. °· Losing weight if necessary. °· Not smoking. °· Limiting alcoholic beverages. °· Learning ways to reduce stress. ° If lifestyle changes are not enough to get your blood pressure under control, your health care provider may prescribe medicine. You may need to take more than one. Work closely with your health care provider to understand the risks and benefits. °HOME CARE INSTRUCTIONS °· Have your blood pressure rechecked as directed by your health care provider.   °· Only take medicine as directed by your health care provider. Follow the directions carefully. Blood pressure medicines must be taken as prescribed. The medicine does not work as well when you skip doses. Skipping doses also puts you at risk for problems.   °· Do not smoke.   °· Monitor your blood pressure at home as directed by your health care provider.  °SEEK MEDICAL CARE IF:  °· You think you are having a reaction to medicines taken. °· You have recurrent headaches or feel dizzy. °· You have swelling in your ankles. °· You have trouble with your vision. °SEEK IMMEDIATE MEDICAL CARE IF: °· You develop a severe headache or   confusion. °· You have unusual weakness, numbness, or feel faint. °· You have severe chest or abdominal pain. °· You vomit repeatedly. °· You have trouble breathing. °MAKE SURE YOU:  °· Understand these instructions. °· Will watch your condition. °· Will get help right away if you are not doing well or get  worse. °Document Released: 05/25/2005 Document Revised: 05/30/2013 Document Reviewed: 03/17/2013 °ExitCare® Patient Information ©2015 [employer]. This information is not intended to replace advice given to you by your health care provider. Make sure you discuss any questions you have with your health care provider. °Chest Pain (Nonspecific) °It is often hard to give a specific diagnosis for the cause of chest pain. There is always a chance that your pain could be related to something serious, such as a heart attack or a blood clot in the lungs. You need to follow up with your health care provider for further evaluation. °CAUSES  °· Heartburn. °· Pneumonia or bronchitis. °· Anxiety or stress. °· Inflammation around your heart (pericarditis) or lung (pleuritis or pleurisy). °· A blood clot in the lung. °· A collapsed lung (pneumothorax). It can develop suddenly on its own (spontaneous pneumothorax) or from trauma to the chest. °· Shingles infection (herpes zoster virus). °The chest wall is composed of bones, muscles, and cartilage. Any of these can be the source of the pain. °· The bones can be bruised by injury. °· The muscles or cartilage can be strained by coughing or overwork. °· The cartilage can be affected by inflammation and become sore (costochondritis). °DIAGNOSIS  °Lab tests or other studies may be needed to find the cause of your pain. Your health care provider may have you take a test called an ambulatory electrocardiogram (ECG). An ECG records your heartbeat patterns over a 24-hour period. You may also have other tests, such as: °· Transthoracic echocardiogram (TTE). During echocardiography, sound waves are used to evaluate how blood flows through your heart. °· Transesophageal echocardiogram (TEE). °· Cardiac monitoring. This allows your health care provider to monitor your heart rate and rhythm in real time. °· Holter monitor. This is a portable device that records your heartbeat and can help  diagnose heart arrhythmias. It allows your health care provider to track your heart activity for several days, if needed. °· Stress tests by exercise or by giving medicine that makes the heart beat faster. °TREATMENT  °· Treatment depends on what may be causing your chest pain. Treatment may include: °¨ Acid blockers for heartburn. °¨ Anti-inflammatory medicine. °¨ Pain medicine for inflammatory conditions. °¨ Antibiotics if an infection is present. °· You may be advised to change lifestyle habits. This includes stopping smoking and avoiding alcohol, caffeine, and chocolate. °· You may be advised to keep your head raised (elevated) when sleeping. This reduces the chance of acid going backward from your stomach into your esophagus. °Most of the time, nonspecific chest pain will improve within 2-3 days with rest and mild pain medicine.  °HOME CARE INSTRUCTIONS  °· If antibiotics were prescribed, take them as directed. Finish them even if you start to feel better. °· For the next few days, avoid physical activities that bring on chest pain. Continue physical activities as directed. °· Do not use any tobacco products, including cigarettes, chewing tobacco, or electronic cigarettes. °· Avoid drinking alcohol. °· Only take medicine as directed by your health care provider. °· Follow your health care provider's suggestions for further testing if your chest pain does not go away. °· Keep any follow-up   appointments you made. If you do not go to an appointment, you could develop lasting (chronic) problems with pain. If there is any problem keeping an appointment, call to reschedule. °SEEK MEDICAL CARE IF:  °· Your chest pain does not go away, even after treatment. °· You have a rash with blisters on your chest. °· You have a fever. °SEEK IMMEDIATE MEDICAL CARE IF:  °· You have increased chest pain or pain that spreads to your arm, neck, jaw, back, or abdomen. °· You have shortness of breath. °· You have an increasing cough,  or you cough up blood. °· You have severe back or abdominal pain. °· You feel nauseous or vomit. °· You have severe weakness. °· You faint. °· You have chills. °This is an emergency. Do not wait to see if the pain will go away. Get medical help at once. Call your local emergency services (911 in [place] Do not drive yourself to the hospital. °MAKE SURE YOU:  °· Understand these instructions. °· Will watch your condition. °· Will get help right away if you are not doing well or get worse. °Document Released: 03/04/2005 Document Revised: 05/30/2013 Document Reviewed: 12/29/2007 °ExitCare® Patient Information ©2015 [employer]. This information is not intended to replace advice given to you by your health care provider. Make sure you discuss any questions you have with your health care provider. ° °

## 2013-12-27 ENCOUNTER — Telehealth: Payer: Self-pay | Admitting: Cardiovascular Disease

## 2013-12-27 NOTE — Telephone Encounter (Signed)
Pt. Called and message left to call me back 

## 2013-12-27 NOTE — Telephone Encounter (Signed)
Please call,pt wants to know if she can take all her medicine at one time? Pr is unable to keep up with taking it.

## 2013-12-27 NOTE — Telephone Encounter (Signed)
Spoke w/patient and daughter - they are very confused about her meds. Patient states she is taking her old meds not the new ones. Hasn't started Hydralazine because of side effect of dizziness and she is very unsteady even w/walker. Discussed with daughter who says she can't help because she is bipolar.  Gave me her daughters number [phone] Willaim ShengBillie - left message to call on cell.

## 2013-12-28 ENCOUNTER — Telehealth: Payer: Self-pay | Admitting: Cardiovascular Disease

## 2013-12-29 NOTE — Telephone Encounter (Signed)
Closed encounter °

## 2014-01-10 ENCOUNTER — Ambulatory Visit: Payer: Medicare Other | Admitting: Cardiology

## 2014-01-17 ENCOUNTER — Ambulatory Visit (INDEPENDENT_AMBULATORY_CARE_PROVIDER_SITE_OTHER): Payer: MEDICARE | Admitting: Physician Assistant

## 2014-01-17 ENCOUNTER — Ambulatory Visit: Payer: Self-pay | Admitting: Emergency Medicine

## 2014-01-17 ENCOUNTER — Encounter: Payer: Self-pay | Admitting: Physician Assistant

## 2014-01-17 VITALS — BP 130/78 | HR 64 | Temp 98.6°F | Resp 16

## 2014-01-17 DIAGNOSIS — J431 Panlobular emphysema: Secondary | ICD-10-CM

## 2014-01-17 DIAGNOSIS — R627 Adult failure to thrive: Secondary | ICD-10-CM

## 2014-01-17 DIAGNOSIS — I200 Unstable angina: Secondary | ICD-10-CM

## 2014-01-17 DIAGNOSIS — J438 Other emphysema: Secondary | ICD-10-CM

## 2014-01-17 NOTE — Patient Instructions (Signed)
Benefiber is good for constipation/diarrhea/irritable bowel syndrome, it helps with weight loss and can help lower your bad cholesterol. Please do 1-2 TBSP in the morning in water, coffee, or tea. It can take up to a month before you can see a difference with your bowel movements. It is cheapest from [employer], [employer], [employer].   Can try amitiza samples, 8 mcg daily for 3 days and if still not good do 78mg  Hospice Hospice is a service that is designed to provide people who are terminally ill and their families with medical, spiritual, and psychological support. Its aim is to improve your quality of life by keeping you as alert and comfortable as possible. Hospice is performed by a team of health care professionals and volunteers who:  Help keep you comfortable. Hospice can be provided in your home or in a homelike setting. The hospice staff works with your family and friends to help meet your needs. You will enjoy the support of loved ones by receiving much of your basic care from family and friends.  Provide pain relief and manage your symptoms. The staff supply all necessary medicines and equipment.  Provide companionship when you are alone.  Allow you and your family to rest. They may do light housekeeping, prepare meals, and run errands.  Provide counseling. They will make sure your emotional, spiritual, and social needs and those of your family are being met.  Provide spiritual care. Spiritual care is individualized to meet your needs and your family's needs. It may involve helping you look at what death means to you, say goodbye, or perform a specific religious ceremony or ritual. Hospice teams often include:  A nurse.  A doctor.  Social workers.  Religious leaders (such as a cClinical biochemist.  Trained volunteers. WHEN SHOULD HOSPICE CARE BEGIN? Most people who use hospice are believed to have fewer than 6 months to live. Your family and health care providers can help you decide when  hospice services should begin. If your condition improves, you may discontinue the program. WHAT [place] Most hospice programs are run by nonprofit, independent organizations. Some are affiliated with hospitals, nursing homes, or home health care agencies. Hospice programs can take place in the home or at a hospice center, hospital, or skilled nursing facility. When choosing a hospice program, ask the following questions:  What services are available to me?  What services are offered to my loved ones?  How involved are my loved ones?  How involved is my health care provider?  Who makes up the hospice care team? How are they trained or screened?  How will my pain and symptoms be managed?  If my circumstances change, can the services be provided in a different setting, such as my home or in the hospital?  Is the program reviewed and licensed by the state or certified in some other way? WHERE CAN I LEARN MORE ABOUT HOSPICE? You can learn about existing hospice programs in your area from your health care providers. You can also read more about hospice online. The websites of the following organizations contain helpful information:  [facility] ([facility].  The Hospice Association of [place] ([place].  The [place]  The American Cancer Society (ACS).  [facility]ice [facility]. Document Released: 09/11/2003 Document Revised: 05/30/2013 Document Reviewed: 04/04/2013 [facility] Cherokee[facility] Information 2015 [place] This information is not intended to replace advice given to you by your health care provider. Make sure you discuss  any questions you have with your health care provider.

## 2014-01-17 NOTE — Progress Notes (Signed)
Assessment and Plan:  Hypertension: Continue medication, monitor blood pressure at home. Continue DASH diet. Cholesterol: Continue diet and exercise. Continue statin Pre-diabetes-Continue diet and exercise. Vitamin D Def- continue medications.  Unstable angina with large thoracic thrombus, conservative/medical treatment, not a surgical candidate, not a candidate for long term anti-coagulation, continue statin, bystolic, and NTG PRN, follow up with cardio on the 24th.  Failure to thrive with protein malnutrition- patient has multiple comorbidities- her and her daughter would like to stay in their home, she is DNR, we will consult hospice.  Had extensive labs in the hospital, will not get labs this visit. Follow up with Cardio.   Continue diet and meds as discussed. Further disposition pending results of labs. OVER 40 minutes of exam, counseling, chart review, referral performed   HPI Pleasant 80 year old female with a history of tobacco abuse 1-1.5 packs per day since age 80, severe COPD,  HLD, prediabetes, hypothyroidism, HTN presents for hospital follow up for chest pain.  She lives at home, is wheel chair bound, her daughter is present.  She was in the hospital from 12/20/2013 - 12/23/2013 for chest pain. While in the hospital she was found to have an extensive thrombus in the throacic aorta and extensive CAD however she is not a surgical candidate and conservative treatment was elected. She will continue the ASA and statin. It is still unclear etiology for the chest pain but it is believed to be a possible pericarditis. Since the hospital she has had two episodes of chest pain, non exertional, resolved with NTG.  She also has malnutrion/failure to thrive. She is wheel chair bound and is unable to bath her self or accomplish basic ADLs with out help due to weakness/shortness of breath. She has COPD and continues to smoke, she is not willing to quit and proudly states she will "smoke until she  dies." She is DNR and her and her daughter agree to conservative treatment and would prefer to stay in the home.   Other than her CP, SOB, and weakness, she complains of constipation since the hospital.   Her blood pressure has been controlled at home on bystolic and hydralazine, today her BP is BP: 130/78 mmHg She does not workout due to COPD, malnutrion/weakness, and being wheelchair bound. She is on cholesterol medication and denies myalgias. Her cholesterol is at goal. The cholesterol last visit was:   Lab Results  Component Value Date   CHOL 139 12/21/2013   HDL 74 12/21/2013   LDLCALC 42 12/21/2013   TRIG 115 12/21/2013   CHOLHDL 1.9 12/21/2013    Current Medications:  Current Outpatient Prescriptions on File Prior to Visit  Medication Sig Dispense Refill  . Acetaminophen (ARTHRITIS PAIN RELIEF PO) Take 1 tablet by mouth 3 (three) times daily as needed (pain).      Marland Kitchen. aspirin EC 81 MG EC tablet Take 1 tablet (81 mg total) by mouth daily.      Marland Kitchen. atorvastatin (LIPITOR) 20 MG tablet Take 1 tablet (20 mg total) by mouth daily at 6 PM.  30 tablet  11  . cholecalciferol (VITAMIN D) 1000 UNITS tablet Take 6,000 Units by mouth daily.       . hydrALAZINE (APRESOLINE) 10 MG tablet Take 1 tablet (10 mg total) by mouth every 8 (eight) hours.  100 tablet  11  . ibuprofen (ADVIL) 200 MG tablet Take 1 tablet (200 mg total) by mouth every 8 (eight) hours as needed for mild pain.  30 tablet  0  . levothyroxine (SYNTHROID, LEVOTHROID) 50 MCG tablet Take 50 mcg by mouth daily before breakfast.      . nebivolol (BYSTOLIC) 10 MG tablet Take 1 tablet (10 mg total) by mouth daily.  30 tablet  11  . nitroGLYCERIN (NITROSTAT) 0.4 MG SL tablet Place 1 tablet (0.4 mg total) under the tongue every 5 (five) minutes as needed for chest pain (severe chest pressure or tightness).  25 tablet  2  . vitamin C (ASCORBIC ACID) 500 MG tablet Take 500 mg by mouth daily.       No current facility-administered medications on  file prior to visit.   Medical History:  Past Medical History  Diagnosis Date  . Mumps   . Hyperlipidemia   . Pre-diabetes   . Thyroid disease     hypothyroid  . Scoliosis   . Sacral fracture 2009  . Tobacco dependence   . Ankle instability     both   . Cerebral palsy   . Allergy   . Hypertension    Allergies:  Allergies  Allergen Reactions  . Ace Inhibitors Other (See Comments)    unknown  . Clindamycin/Lincomycin Other (See Comments)    unsure  . Codeine Other (See Comments)    Could not walk, barely coming to  . Lotrel [Amlodipine Besy-Benazepril Hcl] Other (See Comments)    unknown  . Nickel Other (See Comments)    unknown  . Sulfa Antibiotics Swelling     Review of Systems: [X]  = complains of  [ ]  = denies  General: Fatigue [x ] Fever [ ]  Chills [ ]  Weakness [x ]  Insomnia [ ]  Eyes: Redness [ ]  Blurred vision [ ]  Diplopia [ ]   ENT: Congestion [ ]  Sinus Pain [ ]  Post Nasal Drip [ ]  Sore Throat [ ]  Earache [ ]   Cardiac: Chest pain/pressure [x ] SOB [x ] Orthopnea [ ]   Palpitations [ ]   Paroxysmal nocturnal dyspnea[ ]  Claudication [ ]  Edema [ ]   Pulmonary: Cough [ ]  Wheezing[x ]  SOB [x ]  Snoring [ ]   GI: Nausea [ ]  Vomiting[ ]  Dysphagia[ ]  Heartburn[ ]  Abdominal pain [ ]  Constipation [x ]; Diarrhea [ ] ; BRBPR [ ]  Melena[ ]  GU: Hematuria[ ]  Dysuria [ ]  Nocturia[ ]  Urgency [ ]   Hesitancy [ ]  Discharge [ ]  Neuro: Headaches[ ]  Vertigo[ ]  Paresthesias[ ]  Spasm [ ]  Speech changes [ ]  Incoordination [ ]   Ortho: Arthritis [x ] Joint pain [x ] Muscle pain [ x] Joint swelling [ ]  Back Pain [ ]  Skin:  Rash [ ]   Pruritis [ ]  Change in skin lesion [ ]   Psych: Depression[ ]  Anxiety[ ]  Confusion [ ]  Memory loss [ ]   Heme/Lypmh: Bleeding [ ]  Bruising [ ]  Enlarged lymph nodes [ ]   Endocrine: Visual blurring [ ]  Paresthesia [ ]  Polyuria [ ]  Polydypsea [ ]    Heat/cold intolerance [ ]  Hypoglycemia [ ]   Family history- Review and unchanged Social history- Review and  unchanged Physical Exam: BP 130/78  Pulse 64  Temp(Src) 98.6 F (37 C)  Resp 16 Wt Readings from Last 3 Encounters:  12/23/13 94 lb (42.638 kg)   Appearance: Malnourished, in a wheel chair- in no distress. Eyes: PERRLA, EOMs, conjunctiva no swelling or erythema. Sinuses: No frontal/maxillary tenderness.  ENT/Mouth: EAC's clear, TM's nl w/o erythema, bulging. Nares clear w/o erythema, swelling, exudates. Oropharynx clear without erythema or exudates. Oral hygiene is good. Tongue normal, non obstructing. Hearing decreased.  Neck: Supple. Thyroid normal Chest: Kyphoscoliosis deformity.  Respirations decreased bilateral with mild diffuse wheezing without crackles, rales, rhonchi.  Cor: Heart sounds normal w/ regular rate and rhythm without sig. murmurs, gallops, clicks, or rubs.  Abdomen: Soft & bowel sounds normal. Non-tender w/o guarding, rebound, hernias, masses, or organomegaly.  Lymphatics: Unremarkable.  Musculoskeletal: Unable to stand, in a wheel chair- generalized increased tone with decreased power and bulk throughout. Mild generalized spastic quadriparesis with poor cerebellar coordination throughout. No tremor.    Quentin Mulling 3:22 PM

## 2014-01-23 DIAGNOSIS — I2510 Atherosclerotic heart disease of native coronary artery without angina pectoris: Secondary | ICD-10-CM

## 2014-01-23 DIAGNOSIS — Z515 Encounter for palliative care: Secondary | ICD-10-CM

## 2014-01-25 ENCOUNTER — Telehealth: Payer: Self-pay | Admitting: Internal Medicine

## 2014-01-25 NOTE — Telephone Encounter (Signed)
FYI.Marland Kitchen.Marland Kitchen.PATIENT ADMITTED TO HOSPICE

## 2014-01-30 ENCOUNTER — Ambulatory Visit: Payer: Medicare Other | Admitting: Cardiology

## 2014-02-14 ENCOUNTER — Other Ambulatory Visit: Payer: Self-pay | Admitting: Physician Assistant

## 2014-02-19 ENCOUNTER — Other Ambulatory Visit: Payer: Self-pay | Admitting: Internal Medicine

## 2014-02-19 MED ORDER — HYDRALAZINE HCL 25 MG PO TABS: ORAL_TABLET | ORAL | Status: DC

## 2014-04-10 ENCOUNTER — Encounter: Payer: Self-pay | Admitting: Emergency Medicine

## 2014-05-08 ENCOUNTER — Other Ambulatory Visit: Payer: Self-pay | Admitting: Internal Medicine

## 2014-05-09 ENCOUNTER — Telehealth: Payer: Self-pay

## 2014-05-09 NOTE — Telephone Encounter (Signed)
[facility] and said patient is having trouble falling asleep. Per Dr.McKeown, patient can take Alprazolam 0.25mg  1/2 to 1 tablet QHS prn sleep. Tresa EndoKelly aware

## 2014-06-13 ENCOUNTER — Other Ambulatory Visit: Payer: Self-pay | Admitting: Physician Assistant

## 2014-10-08 ENCOUNTER — Other Ambulatory Visit: Payer: Self-pay | Admitting: Physician Assistant

## 2015-01-04 ENCOUNTER — Other Ambulatory Visit: Payer: Self-pay | Admitting: Physician Assistant

## 2015-01-04 ENCOUNTER — Other Ambulatory Visit: Payer: Self-pay | Admitting: Internal Medicine

## 2015-01-11 ENCOUNTER — Other Ambulatory Visit: Payer: Self-pay

## 2015-01-11 MED ORDER — LEVOTHYROXINE SODIUM 50 MCG PO TABS: ORAL_TABLET | ORAL | Status: AC

## 2015-01-11 MED ORDER — NEBIVOLOL HCL 10 MG PO TABS: 10.0000 mg | ORAL_TABLET | Freq: Every day | ORAL | Status: DC

## 2015-01-11 MED ORDER — ATENOLOL 100 MG PO TABS: 100.0000 mg | ORAL_TABLET | Freq: Every day | ORAL | Status: DC

## 2015-05-07 ENCOUNTER — Telehealth: Payer: Self-pay | Admitting: Internal Medicine

## 2015-05-07 NOTE — Telephone Encounter (Signed)
[facility] contacted by Elmarie MainlandLinda Coleman, daugher, family needs Hospice evaluation to bring patient back under Hospice care. [facility] to [facility] signed.  Thank you, Theodoro KalataKatrina Welch Referral Coordinator  [facility] [phone]. [phone] Fax [phone]

## 2015-07-22 ENCOUNTER — Telehealth: Payer: Self-pay | Admitting: *Deleted

## 2015-07-22 NOTE — Telephone Encounter (Signed)
Per Dr Oneta Rack, records were faxed to [facility] at [phone].

## 2015-09-05 ENCOUNTER — Other Ambulatory Visit: Payer: Self-pay | Admitting: Internal Medicine

## 2015-11-19 ENCOUNTER — Emergency Department (HOSPITAL_COMMUNITY)
Admission: EM | Admit: 2015-11-19 | Discharge: 2015-11-19 | Disposition: A | Discharge status: Home / Self Care | Payer: Medicare Other | Attending: Emergency Medicine | Admitting: Emergency Medicine

## 2015-11-19 ENCOUNTER — Encounter (HOSPITAL_COMMUNITY): Payer: Self-pay | Admitting: *Deleted

## 2015-11-19 DIAGNOSIS — Z791 Long term (current) use of non-steroidal anti-inflammatories (NSAID): Secondary | ICD-10-CM | POA: Diagnosis not present

## 2015-11-19 DIAGNOSIS — Z8673 Personal history of transient ischemic attack (TIA), and cerebral infarction without residual deficits: Secondary | ICD-10-CM | POA: Diagnosis not present

## 2015-11-19 DIAGNOSIS — F17210 Nicotine dependence, cigarettes, uncomplicated: Secondary | ICD-10-CM | POA: Insufficient documentation

## 2015-11-19 DIAGNOSIS — I10 Essential (primary) hypertension: Secondary | ICD-10-CM | POA: Insufficient documentation

## 2015-11-19 DIAGNOSIS — Z79899 Other long term (current) drug therapy: Secondary | ICD-10-CM | POA: Diagnosis not present

## 2015-11-19 DIAGNOSIS — W19XXXA Unspecified fall, initial encounter: Secondary | ICD-10-CM

## 2015-11-19 DIAGNOSIS — Z7982 Long term (current) use of aspirin: Secondary | ICD-10-CM | POA: Insufficient documentation

## 2015-11-19 NOTE — ED Notes (Signed)
Per EMS: called out for fall from ground level, found by Child psychotherapistsocial worker. Down for approximately 2 hours. Pt reports more right back pain than normal. Denies c spine tenderness. Pt was hypertensive when collecting VS resulting in transport to ED

## 2015-11-19 NOTE — Discharge Instructions (Signed)
Hypertension Hypertension, commonly called high blood pressure, is when the force of blood pumping through your arteries is too strong. Your arteries are the blood vessels that carry blood from your heart throughout your body. A blood pressure reading consists of a higher number over a lower number, such as 110/72. The higher number (systolic) is the pressure inside your arteries when your heart pumps. The lower number (diastolic) is the pressure inside your arteries when your heart relaxes. Ideally you want your blood pressure below 120/80. Hypertension forces your heart to work harder to pump blood. Your arteries may become narrow or stiff. Having untreated or uncontrolled hypertension can cause heart attack, stroke, kidney disease, and other problems. RISK FACTORS Some risk factors for high blood pressure are controllable. Others are not.  Risk factors you cannot control include:   Race. You may be at higher risk if you are African American.  Age. Risk increases with age.  Gender. Men are at higher risk than women before age 45 years. After age 65, women are at higher risk than men. Risk factors you can control include:  Not getting enough exercise or physical activity.  Being overweight.  Getting too much fat, sugar, calories, or salt in your diet.  Drinking too much alcohol. SIGNS AND SYMPTOMS Hypertension does not usually cause signs or symptoms. Extremely high blood pressure (hypertensive crisis) may cause headache, anxiety, shortness of breath, and nosebleed. DIAGNOSIS To check if you have hypertension, your health care provider will measure your blood pressure while you are seated, with your arm held at the level of your heart. It should be measured at least twice using the same arm. Certain conditions can cause a difference in blood pressure between your right and left arms. A blood pressure reading that is higher than normal on one occasion does not mean that you need treatment. If  it is not clear whether you have high blood pressure, you may be asked to return on a different day to have your blood pressure checked again. Or, you may be asked to monitor your blood pressure at home for 1 or more weeks. TREATMENT Treating high blood pressure includes making lifestyle changes and possibly taking medicine. Living a healthy lifestyle can help lower high blood pressure. You may need to change some of your habits. Lifestyle changes may include:  Following the DASH diet. This diet is high in fruits, vegetables, and whole grains. It is low in salt, red meat, and added sugars.  Keep your sodium intake below 2,300 mg per day.  Getting at least 30-45 minutes of aerobic exercise at least 4 times per week.  Losing weight if necessary.  Not smoking.  Limiting alcoholic beverages.  Learning ways to reduce stress. Your health care provider may prescribe medicine if lifestyle changes are not enough to get your blood pressure under control, and if one of the following is true:  You are 18-59 years of age and your systolic blood pressure is above 140.  You are 60 years of age or older, and your systolic blood pressure is above 150.  Your diastolic blood pressure is above 90.  You have diabetes, and your systolic blood pressure is over 140 or your diastolic blood pressure is over 90.  You have kidney disease and your blood pressure is above 140/90.  You have heart disease and your blood pressure is above 140/90. Your personal target blood pressure may vary depending on your medical conditions, your age, and other factors. HOME CARE INSTRUCTIONS    Have your blood pressure rechecked as directed by your health care provider.   Take medicines only as directed by your health care provider. Follow the directions carefully. Blood pressure medicines must be taken as prescribed. The medicine does not work as well when you skip doses. Skipping doses also puts you at risk for  problems.  Do not smoke.   Monitor your blood pressure at home as directed by your health care provider. SEEK MEDICAL CARE IF:   You think you are having a reaction to medicines taken.  You have recurrent headaches or feel dizzy.  You have swelling in your ankles.  You have trouble with your vision. SEEK IMMEDIATE MEDICAL CARE IF:  You develop a severe headache or confusion.  You have unusual weakness, numbness, or feel faint.  You have severe chest or abdominal pain.  You vomit repeatedly.  You have trouble breathing. MAKE SURE YOU:   Understand these instructions.  Will watch your condition.  Will get help right away if you are not doing well or get worse.   This information is not intended to replace advice given to you by your health care provider. Make sure you discuss any questions you have with your health care provider.   Document Released: 05/25/2005 Document Revised: 10/09/2014 Document Reviewed: 03/17/2013 Elsevier Interactive Patient Education 2016 [employer]  

## 2015-11-19 NOTE — ED Notes (Signed)
Daughter on the way to pick patient up

## 2015-11-19 NOTE — ED Provider Notes (Signed)
CSN: [id number]     Arrival date & time 11/19/15  1503 History   First MD Initiated Contact with Patient 11/19/15 1516     Chief Complaint  Patient presents with  . Hypertension      Patient is a 82 y.o. female presenting with hypertension. The history is provided by the patient.  Hypertension This is a recurrent problem. Pertinent negatives include no chest pain, no abdominal pain, no headaches and no shortness of breath.  Patient presents with a fall. Has a history of cerebral palsy. States that she falls all the time. States this was really no different. States that the social worker came to her house and said that she did come to the hospital because she could've broken something. Patient does have some right sided back pain, at her baseline. No numbness or weakness. No confusion. Did not hit her head. States that she was on several medicines but recently has gone down to only 1. No chest pain. No trouble breathing. No dysuria. She was on the ground for around 2 hours. Patient states that she likes doctors and watching hospital shows. Patient walks with a walker at her baseline.  Past Medical History  Diagnosis Date  . Mumps   . Hyperlipidemia   . Pre-diabetes   . Thyroid disease     hypothyroid  . Scoliosis   . Sacral fracture (HCC) 2009  . Tobacco dependence   . Ankle instability     both   . Cerebral palsy (HCC)   . Allergy   . Hypertension    Past Surgical History  Procedure Laterality Date  . Iliac artery stent Left 2009    with fem/fem bypass  . Abdominal aortic aneurysm repair     Family History  Problem Relation Age of Onset  . Alzheimer's disease Mother   . Stroke Father   . Heart attack Father   . Heart disease Father   . Alzheimer's disease Father   . Depression Daughter   . Breast cancer Maternal Aunt   . Cancer Maternal Aunt   . Colon cancer Maternal Aunt   . Heart disease Paternal Grandfather    Social History  Substance Use Topics  . Smoking  status: Current Every Day Smoker -- 1.50 packs/day for 66 years    Types: Cigarettes  . Smokeless tobacco: None  . Alcohol Use: No   OB History    No data available     Review of Systems  Constitutional: Negative for activity change, appetite change and fatigue.  Eyes: Negative for pain.  Respiratory: Negative for chest tightness and shortness of breath.   Cardiovascular: Negative for chest pain and leg swelling.  Gastrointestinal: Negative for nausea, vomiting, abdominal pain and diarrhea.  Genitourinary: Negative for flank pain.  Musculoskeletal: Positive for back pain. Negative for neck stiffness.  Skin: Negative for rash.  Neurological: Negative for weakness, numbness and headaches.  Psychiatric/Behavioral: Negative for behavioral problems.      Allergies  Ace inhibitors; Clindamycin/lincomycin; Codeine; Lotrel; Nickel; and Sulfa antibiotics  Home Medications   Prior to Admission medications   Medication Sig Start Date End Date Taking? Authorizing Provider  Acetaminophen (ARTHRITIS PAIN RELIEF PO) Take 1 tablet by mouth 3 (three) times daily as needed (pain).    Historical Provider, MD  aspirin EC 81 MG EC tablet Take 1 tablet (81 mg total) by mouth daily. 12/23/13   Abelino Derrick, PA-C  atenolol (TENORMIN) 100 MG tablet Take 1 tablet (100 mg total) by  mouth daily. 01/11/15   Lucky CowboyWilliam McKeown, MD  atorvastatin (LIPITOR) 20 MG tablet Take 1 tablet (20 mg total) by mouth daily at 6 PM. 12/23/13   Abelino DerrickLuke K Kilroy, PA-C  cholecalciferol (VITAMIN D) 1000 UNITS tablet Take 6,000 Units by mouth daily.     Historical Provider, MD  hydrALAZINE (APRESOLINE) 25 MG tablet TAKE 1 TABLET THREE TIMES DAILY BEFORE MEALS FOR BLOOD PRESSURE. 09/05/15   Lucky CowboyWilliam McKeown, MD  ibuprofen (ADVIL) 200 MG tablet Take 1 tablet (200 mg total) by mouth every 8 (eight) hours as needed for mild pain. 12/23/13   Abelino DerrickLuke K Kilroy, PA-C  levothyroxine (SYNTHROID, LEVOTHROID) 50 MCG tablet TAKE 1 TABLET ONCE DAILY BEFORE  BREAKFAST. 01/11/15   Lucky CowboyWilliam McKeown, MD  nebivolol (BYSTOLIC) 10 MG tablet Take 1 tablet (10 mg total) by mouth daily. 01/11/15   Lucky CowboyWilliam McKeown, MD  nitroGLYCERIN (NITROSTAT) 0.4 MG SL tablet Place 1 tablet (0.4 mg total) under the tongue every 5 (five) minutes as needed for chest pain (severe chest pressure or tightness). 12/23/13   Abelino DerrickLuke K Kilroy, PA-C  vitamin C (ASCORBIC ACID) 500 MG tablet Take 500 mg by mouth daily.    Historical Provider, MD   BP 198/109 mmHg  Pulse 87  Temp(Src) 98.1 F (36.7 C) (Oral)  Resp 20  SpO2 96% Physical Exam  Constitutional: She appears well-developed.  HENT:  Head: Atraumatic.  Eyes: EOM are normal.  Neck: Neck supple.  Cardiovascular: Normal rate.   Pulmonary/Chest: Effort normal.  Abdominal: Soft. There is no tenderness.  Musculoskeletal: Normal range of motion.  Some chronic scoliosis. No lumbar tenderness. No extremity tenderness. Good range of motion in shoulders elbows hips knees.  Neurological: She is alert.  Skin: Skin is warm.    ED Course  Procedures (including critical care time) Labs Review Labs Reviewed - No data to display  Imaging Review No results found. I have personally reviewed and evaluated these images and lab results as part of my medical decision-making.   EKG Interpretation None      MDM   Final diagnoses:  Fall, initial encounter  Essential hypertension    Patient with fall. Some hypertension without other complaints. States they have cut her down to only one medicine. States she also thinks her blood pressure is high from being in the hospital. Will follow-up with her primary care doctor for blood pressure this time does not appear to be any severe traumatic injury and will have patient follow-up with her primary care doctor.    Benjiman CoreNathan Meer Reindl, MD 11/19/15 1539

## 2017-05-19 ENCOUNTER — Emergency Department (HOSPITAL_COMMUNITY)

## 2017-05-19 ENCOUNTER — Emergency Department (HOSPITAL_COMMUNITY)
Admission: EM | Admit: 2017-05-19 | Discharge: 2017-05-19 | Disposition: A | Discharge status: Home / Self Care | Attending: Emergency Medicine | Admitting: Emergency Medicine

## 2017-05-19 ENCOUNTER — Encounter (HOSPITAL_COMMUNITY): Payer: Self-pay | Admitting: Emergency Medicine

## 2017-05-19 DIAGNOSIS — E039 Hypothyroidism, unspecified: Secondary | ICD-10-CM | POA: Insufficient documentation

## 2017-05-19 DIAGNOSIS — I10 Essential (primary) hypertension: Secondary | ICD-10-CM | POA: Insufficient documentation

## 2017-05-19 DIAGNOSIS — Z7982 Long term (current) use of aspirin: Secondary | ICD-10-CM | POA: Diagnosis not present

## 2017-05-19 DIAGNOSIS — R4182 Altered mental status, unspecified: Secondary | ICD-10-CM | POA: Diagnosis present

## 2017-05-19 DIAGNOSIS — F17210 Nicotine dependence, cigarettes, uncomplicated: Secondary | ICD-10-CM | POA: Insufficient documentation

## 2017-05-19 DIAGNOSIS — R531 Weakness: Secondary | ICD-10-CM | POA: Insufficient documentation

## 2017-05-19 DIAGNOSIS — Z79899 Other long term (current) drug therapy: Secondary | ICD-10-CM | POA: Insufficient documentation

## 2017-05-19 LAB — URINALYSIS, ROUTINE W REFLEX MICROSCOPIC
Bilirubin Urine: NEGATIVE
Glucose, UA: NEGATIVE mg/dL
Ketones, ur: NEGATIVE mg/dL
Leukocytes, UA: NEGATIVE
Nitrite: NEGATIVE
PH: 5 (ref 5.0–8.0)
Protein, ur: 30 mg/dL — AB
SPECIFIC GRAVITY, URINE: 1.021 (ref 1.005–1.030)
Squamous Epithelial / LPF: NONE SEEN

## 2017-05-19 LAB — COMPREHENSIVE METABOLIC PANEL
ALT: 11 U/L — AB (ref 14–54)
AST: 24 U/L (ref 15–41)
Albumin: 3.6 g/dL (ref 3.5–5.0)
Alkaline Phosphatase: 86 U/L (ref 38–126)
Anion gap: 10 (ref 5–15)
BUN: 17 mg/dL (ref 6–20)
CO2: 28 mmol/L (ref 22–32)
Calcium: 9 mg/dL (ref 8.9–10.3)
Chloride: 100 mmol/L — ABNORMAL LOW (ref 101–111)
Creatinine, Ser: 0.71 mg/dL (ref 0.44–1.00)
GFR calc Af Amer: >60 mL/min (ref >60)
GFR calc non Af Amer: >60 mL/min (ref >60)
GLUCOSE: 105 mg/dL — AB (ref 65–99)
POTASSIUM: 3.9 mmol/L (ref 3.5–5.1)
Sodium: 138 mmol/L (ref 135–145)
Total Bilirubin: 1 mg/dL (ref 0.3–1.2)
Total Protein: 6.9 g/dL (ref 6.5–8.1)

## 2017-05-19 LAB — CBC WITH DIFFERENTIAL/PLATELET
Basophils Absolute: 0 10*3/uL (ref 0.0–0.1)
Basophils Relative: 0 %
EOS PCT: 1 %
Eosinophils Absolute: 0.1 10*3/uL (ref 0.0–0.7)
HCT: 43.2 % (ref 36.0–46.0)
Hemoglobin: 14.1 g/dL (ref 12.0–15.0)
LYMPHS ABS: 0.9 10*3/uL (ref 0.7–4.0)
LYMPHS PCT: 12 %
MCH: 29.8 pg (ref 26.0–34.0)
MCHC: 32.6 g/dL (ref 30.0–36.0)
MCV: 91.3 fL (ref 78.0–100.0)
Monocytes Absolute: 0.6 10*3/uL (ref 0.1–1.0)
Monocytes Relative: 7 %
Neutro Abs: 6.3 10*3/uL (ref 1.7–7.7)
Neutrophils Relative %: 80 %
PLATELETS: 163 10*3/uL (ref 150–400)
RBC: 4.73 MIL/uL (ref 3.87–5.11)
RDW: 13.6 % (ref 11.5–15.5)
WBC: 7.9 10*3/uL (ref 4.0–10.5)

## 2017-05-19 LAB — I-STAT TROPONIN, ED: Troponin i, poc: 0.01 ng/mL (ref 0.00–0.08)

## 2017-05-19 MED ORDER — HYDRALAZINE HCL 25 MG PO TABS: 25.0000 mg | ORAL_TABLET | Freq: Once | ORAL | Status: DC

## 2017-05-19 MED ORDER — SODIUM CHLORIDE 0.9 % IV BOLUS (SEPSIS)
500.0000 mL | Freq: Once | INTRAVENOUS | Status: AC
  Administered 2017-05-19: 500 mL via INTRAVENOUS

## 2017-05-19 NOTE — ED Triage Notes (Signed)
Per EMS, patient from home, family reports generalized weakness with falls and altered mental status x2 days. A&Ox3. Disoriented to time. Hx cerebral palsy. Family reports foul odor to urine. Hospice patient. DNR at bedside.  BP 200/120 Hx HTN. HR 90 CBG 110 RA 91%

## 2017-05-19 NOTE — ED Provider Notes (Signed)
[facility]-EMERGENCY DEPT Provider Note   CSN: [id number] Arrival date & time: 05/19/17  1149     History   Chief Complaint Chief Complaint  Patient presents with  . Altered Mental Status    HPI Katrina Meadows is a 82 y.o. female with a past medical history of cerebral palsy, hypertension, thyroid disease, who presents to ED for evaluation of generalized weakness, altered mental status for the past 2 days.  Level 5 caveat due to altered mental status.  Per EMS, family complains that she has had 2-day history of several falls, generalized weakness and altered mental status as well as a foul smell in her urine.  She was able to tell me that she lives at home with her daughter but is unsure if she will come here to the ED. She denies any pain and states that "I am as comfortable as I could get." Upon speaking to patient's daughter, it appears that patient has had a gradual decline in her mental status for the past 3 weeks. She has suffered from a few falls during this time as well. She also reports disoriented to time, "thinking night time is day time and vice versa." Daughter states she has been sleeping a lot, unsteady on feet. Is evaluated by hospice provider 2-3 times weekly. Has not seen per PCP in ~3 years.  HPI  Past Medical History:  Diagnosis Date  . Allergy   . Ankle instability    both   . Cerebral palsy (HCC)   . Hyperlipidemia   . Hypertension   . Mumps   . Pre-diabetes   . Sacral fracture (HCC) 2009  . Scoliosis   . Thyroid disease    hypothyroid  . Tobacco dependence     Patient Active Problem List   Diagnosis Date Noted  . Chest pain with moderate risk of acute coronary syndrome 12/20/2013  . Abdominal pain 12/20/2013  . Pericardial effusion on CT, trivial on echo 12/20/2013  . Aortic mural thrombus (HCC) 12/20/2013  . Unspecified essential hypertension 05/10/2013  . Mixed hyperlipidemia 05/10/2013  . Other abnormal glucose  05/10/2013  . Unspecified vitamin D deficiency 05/10/2013  . Unspecified hypothyroidism 05/10/2013  . Anemia, unspecified 05/10/2013    Past Surgical History:  Procedure Laterality Date  . ABDOMINAL AORTIC ANEURYSM REPAIR    . ILIAC ARTERY STENT Left 2009   with fem/fem bypass    OB History    No data available       Home Medications    Prior to Admission medications   Medication Sig Start Date End Date Taking? Authorizing Provider  acetaminophen (TYLENOL) 500 MG tablet Take 500 mg by mouth 2 (two) times daily as needed for mild pain.   Yes [provider]  amLODipine (NORVASC) 10 MG tablet Take 10 mg by mouth daily.   Yes [provider]  levothyroxine (SYNTHROID, LEVOTHROID) 50 MCG tablet TAKE 1 TABLET ONCE DAILY BEFORE BREAKFAST. 01/11/15  Yes Lucky Cowboy, MD  aspirin EC 81 MG EC tablet Take 1 tablet (81 mg total) by mouth daily. Patient not taking: Reported on 05/19/2017 12/23/13   Abelino Derrick, PA-C  atenolol (TENORMIN) 100 MG tablet Take 1 tablet (100 mg total) by mouth daily. Patient not taking: Reported on 05/19/2017 01/11/15   Lucky Cowboy, MD  atorvastatin (LIPITOR) 20 MG tablet Take 1 tablet (20 mg total) by mouth daily at 6 PM. Patient not taking: Reported on 05/19/2017 12/23/13   Abelino Derrick, PA-C  hydrALAZINE (APRESOLINE) 25 MG tablet TAKE 1 TABLET THREE TIMES DAILY BEFORE MEALS FOR BLOOD PRESSURE. Patient not taking: Reported on 05/19/2017 09/05/15   Lucky Cowboy, MD  ibuprofen (ADVIL) 200 MG tablet Take 1 tablet (200 mg total) by mouth every 8 (eight) hours as needed for mild pain. Patient not taking: Reported on 05/19/2017 12/23/13   Abelino Derrick, PA-C  nebivolol (BYSTOLIC) 10 MG tablet Take 1 tablet (10 mg total) by mouth daily. Patient not taking: Reported on 05/19/2017 01/11/15   Lucky Cowboy, MD  nitroGLYCERIN (NITROSTAT) 0.4 MG SL tablet Place 1 tablet (0.4 mg total) under the tongue every 5 (five) minutes as needed for chest  pain (severe chest pressure or tightness). Patient not taking: Reported on 05/19/2017 12/23/13   Abelino Derrick, PA-C    Family History Family History  Problem Relation Age of Onset  . Alzheimer's disease Mother   . Stroke Father   . Heart attack Father   . Heart disease Father   . Alzheimer's disease Father   . Depression Daughter   . Breast cancer Maternal Aunt   . Cancer Maternal Aunt   . Colon cancer Maternal Aunt   . Heart disease Paternal Grandfather     Social History Social History   Tobacco Use  . Smoking status: Current Every Day Smoker    Packs/day: 1.50    Years: 66.00    Pack years: 99.00    Types: Cigarettes  Substance Use Topics  . Alcohol use: No  . Drug use: No     Allergies   Ace inhibitors; Clindamycin/lincomycin; Codeine; Lotrel [amlodipine besy-benazepril hcl]; Nickel; and Sulfa antibiotics   Review of Systems Review of Systems  Constitutional: Positive for fatigue.  Genitourinary:       + Foul-smelling urine  Neurological: Positive for weakness.     Physical Exam Updated Vital Signs BP (!) 189/83   Pulse 74   Temp (!) 97.3 F (36.3 C) (Oral)   Resp 14   SpO2 96%   Physical Exam  Constitutional: She appears well-developed and well-nourished. No distress.  Nontoxic appearing and in no acute distress. Appears mildly dehydrated/dry.  HENT:  Head: Normocephalic and atraumatic.  Nose: Nose normal.  Eyes: Conjunctivae and EOM are normal. Right eye exhibits no discharge. Left eye exhibits no discharge. No scleral icterus.  Neck: Normal range of motion. Neck supple.  Cardiovascular: Normal rate, regular rhythm, normal heart sounds and intact distal pulses. Exam reveals no gallop and no friction rub.  No murmur heard. Pulmonary/Chest: Effort normal and breath sounds normal. No respiratory distress.  Abdominal: Soft. Bowel sounds are normal. She exhibits no distension. There is no tenderness. There is no guarding.  No abdominal tenderness  to palpation.  Musculoskeletal: Normal range of motion. She exhibits no edema.  Neurological: She is alert. No cranial nerve deficit. She exhibits normal muscle tone. Coordination normal.  Pupils reactive. No facial asymmetry noted. Cranial nerves appear grossly intact. Sensation intact to light touch on face, BUE and BLE. Alert and oriented to person, city, month.  Unable to tell me day of week, year or name of current president although she states "I can't remember his name, but I can't stand that man."  Skin: Skin is warm and dry. No rash noted. She is not diaphoretic.  Psychiatric: She has a normal mood and affect.  Nursing note and vitals reviewed.    ED Treatments / Results  Labs (all labs ordered are listed, but only abnormal results are displayed)  Labs Reviewed  COMPREHENSIVE METABOLIC PANEL - Abnormal; Notable for the following components:      Result Value   Chloride 100 (*)    Glucose, Bld 105 (*)    ALT 11 (*)    All other components within normal limits  URINALYSIS, ROUTINE W REFLEX MICROSCOPIC - Abnormal; Notable for the following components:   Hgb urine dipstick MODERATE (*)    Protein, ur 30 (*)    Bacteria, UA MANY (*)    All other components within normal limits  URINE CULTURE  CBC WITH DIFFERENTIAL/PLATELET  I-STAT TROPONIN, ED    EKG  EKG Interpretation None       Radiology Dg Chest 2 View  Result Date: 05/19/2017 CLINICAL DATA:  83 year old female with generalize weakness, falls, altered mental status, foul smelling urine. EXAM: CHEST  2 VIEW COMPARISON:  Chest CTA and chest radiographs 12/20/2013 FINDINGS: semi upright AP and lateral views of the chest. Moderate to severe tortuosity and atherosclerosis of the thoracic aorta. Chronic abdominal aortic endograft. Stable cardiac size and mediastinal contours. Stable lung volumes. No pneumothorax, pleural effusion, pulmonary edema or confluent pulmonary opacity. Osteopenia. Stable visualized osseous  structures. Negative visible bowel gas pattern. IMPRESSION: 1.  No acute cardiopulmonary abnormality. 2. Severe Aortic Atherosclerosis (ICD10-I70.0). Electronically Signed   By: Odessa FlemingH  Hall M.D.   On: 05/19/2017 14:30   Ct Head Wo Contrast  Result Date: 05/19/2017 CLINICAL DATA:  Generalized weakness with falls and altered mental status. EXAM: CT HEAD WITHOUT CONTRAST TECHNIQUE: Contiguous axial images were obtained from the base of the skull through the vertex without intravenous contrast. COMPARISON:  None. FINDINGS: Brain: No mass effect, hemorrhage or extra-axial collection. There is periventricular hypoattenuation compatible with chronic microvascular disease. At the right aspect of the superior falx cerebri there is a meningioma measuring 12 x 7 mm. No abnormality of the adjacent brain. There is no evidence of acute cortical infarct. Gray-white differentiation is preserved. Vascular: Atherosclerotic calcification of the vertebral and internal carotid arteries at the skull base. No hyperdense artery. No abnormal attenuation or expansion of the dural venous sinuses. Skull: No skull fracture. Normal skullbase. Small superior right frontal scalp hematoma. Sinuses/Orbits: Paranasal sinuses are clear. No mastoid or middle ear effusion. Normal orbits. Other: None. IMPRESSION: 1. No acute intracranial abnormality. 2. Chronic ischemic microangiopathic changes. 3. Small right parafalcine meningioma measuring 12 x 7 millimeters without abnormality of the underlying brain. 4. Small right frontal scalp hematoma without underlying skull fracture. Electronically Signed   By: Deatra RobinsonKevin  Herman M.D.   On: 05/19/2017 14:24    Procedures Procedures (including critical care time)  Medications Ordered in ED Medications  sodium chloride 0.9 % bolus 500 mL (0 mLs Intravenous Stopped 05/19/17 1612)     Initial Impression / Assessment and Plan / ED Course  I have reviewed the triage vital signs and the nursing  notes.  Pertinent labs & imaging results that were available during my care of the patient were reviewed by me and considered in my medical decision making (see chart for details).     Patient presents to ED for evaluation of progressively declining mental status, falls and possible foul smelling urine for the past 3 weeks.  Spoke to daughter who states that symptoms have been going on and gotten progressively worse.  Her most recent fall was today.  She also reports disoriented to time stating that daytime is nighttime and vice versa.  She is followed by hospice provider 2-3 times per week.  Here  she is nontoxic-appearing and in no acute distress.  She is alert and oriented to self.  She is afebrile.  She is hypertensive here and daughter reports compliance with her home HTN medications.  Lab work including CBC, BMP, CT of head unremarkable.  Urinalysis will be sent for culture patient will be put on antibiotics accordingly. I believe her symptoms are due to a progressive decline in her mental status from her chronic conditions, rather than acute intracranial abnormality or infectious process.  Advised her to follow-up with her hospice provider for further evaluation.  Patient appears stable for discharge at this time.  Strict return precautions given.  Patient discussed with and seen by Dr. Ranae PalmsYelverton.  Portions of this note were generated with Scientist, clinical (histocompatibility and immunogenetics)Dragon dictation software. Dictation errors may occur despite best attempts at proofreading.   Final Clinical Impressions(s) / ED Diagnoses   Final diagnoses:  Altered mental status, unspecified altered mental status type    ED Discharge Orders    None       Dietrich PatesKhatri, Abygail Galeno, PA-C 05/19/17 1634    Loren RacerYelverton, David, MD 05/22/17 1125

## 2017-05-19 NOTE — Discharge Instructions (Signed)
Continue home medications as previously prescribed. We will contact you with results of your urine culture when they are available in a few days. We will then determine if you need to be on any antibiotics for UTI. Your labwork, head CT today was unremarkable for any acute process. Follow up with your primary care provider for further evaluation. Return to ED for worsening symptoms, numbness/weakness on one side of body, trouble breathing, trouble swallowing.

## 2017-05-19 NOTE — ED Notes (Signed)
[facility] called for transport.  

## 2017-05-19 NOTE — ED Notes (Signed)
Patient transported to CT 

## 2017-05-21 LAB — URINE CULTURE: CULTURE: NO GROWTH

## 2017-10-14 ENCOUNTER — Non-Acute Institutional Stay (SKILLED_NURSING_FACILITY): Payer: Medicare Other | Admitting: Adult Health

## 2017-10-14 ENCOUNTER — Encounter: Payer: Self-pay | Admitting: Adult Health

## 2017-10-14 DIAGNOSIS — E034 Atrophy of thyroid (acquired): Secondary | ICD-10-CM | POA: Diagnosis not present

## 2017-10-14 DIAGNOSIS — I10 Essential (primary) hypertension: Secondary | ICD-10-CM

## 2017-10-14 DIAGNOSIS — R52 Pain, unspecified: Secondary | ICD-10-CM

## 2017-10-14 DIAGNOSIS — G8929 Other chronic pain: Secondary | ICD-10-CM

## 2017-10-14 DIAGNOSIS — R64 Cachexia: Secondary | ICD-10-CM

## 2017-10-14 DIAGNOSIS — E43 Unspecified severe protein-calorie malnutrition: Secondary | ICD-10-CM

## 2017-10-14 DIAGNOSIS — D638 Anemia in other chronic diseases classified elsewhere: Secondary | ICD-10-CM

## 2017-10-14 NOTE — Progress Notes (Signed)
Location:   [facility] Room Number: 211 A Place of Service:  SNF (31)   CODE STATUS: DNR  Allergies  Allergen Reactions  . Ace Inhibitors Other (See Comments)    unknown  . Chocolate   . Clindamycin/Lincomycin Other (See Comments)    unsure  . Codeine Other (See Comments)    Could not walk, barely coming to  . Copper-Containing Compounds   . Lotrel [Amlodipine Besy-Benazepril Hcl] Other (See Comments)    unknown  . Nickel Other (See Comments)    unknown  . Sulfa Antibiotics Swelling    Chief Complaint  Patient presents with  . Acute Visit    Transfer in    HPI:  She is being transferred to this facility from home. Her needs were not being able to be met at home. She is unable to fully participate in the hpi or ros; but she did deny pain. There are no reports of poor appetite; no reports of insomnia. She told me that she is ready to go home. She will continue to be followed for her chronic illnesses including: hypothyroidism; hypertension; anemia. There are no nursing concerns at this time.   Past Medical History:  Diagnosis Date  . Allergy   . Ankle instability    both   . Cerebral palsy ([place])   . Hyperlipidemia   . Hypertension   . Mumps   . Pre-diabetes   . Sacral fracture ([place]) 2009  . Scoliosis   . Thyroid disease    hypothyroid  . Tobacco dependence     Past Surgical History:  Procedure Laterality Date  . ABDOMINAL AORTIC ANEURYSM REPAIR    . ILIAC ARTERY STENT Left 2009   with fem/fem bypass    Social History   Socioeconomic History  . Marital status: Divorced    Spouse name: Not on file  . Number of children: Not on file  . Years of education: Not on file  . Highest education level: Not on file  Occupational History  . Not on file  Social Needs  . Financial resource strain: Not on file  . Food insecurity:    Worry: Not on file    Inability: Not on file  . Transportation needs:    Medical: Not on file    Non-medical:  Not on file  Tobacco Use  . Smoking status: Current Every Day Smoker    Packs/day: 1.50    Years: 66.00    Pack years: 99.00    Types: Cigarettes  . Smokeless tobacco: Never Used  Substance and Sexual Activity  . Alcohol use: No  . Drug use: No  . Sexual activity: Not on file  Lifestyle  . Physical activity:    Days per week: Not on file    Minutes per session: Not on file  . Stress: Not on file  Relationships  . Social connections:    Talks on phone: Not on file    Gets together: Not on file    Attends religious service: Not on file    Active member of club or organization: Not on file    Attends meetings of clubs or organizations: Not on file    Relationship status: Not on file  . Intimate partner violence:    Fear of current or ex partner: Not on file    Emotionally abused: Not on file    Physically abused: Not on file    Forced sexual activity: Not on file  Other Topics Concern  .  Not on file  Social History Narrative   ** Merged History Encounter **       Family History  Problem Relation Age of Onset  . Alzheimer's disease Mother   . Stroke Father   . Heart attack Father   . Heart disease Father   . Alzheimer's disease Father   . Depression Daughter   . Breast cancer Maternal Aunt   . Cancer Maternal Aunt   . Colon cancer Maternal Aunt   . Heart disease Paternal Grandfather       VITAL SIGNS Pulse 86   Temp (!) 97.1 F (36.2 C)   Resp 18   Ht '5\' 4"'  (1.626 m)   Wt 79 lb 1.6 oz (35.9 kg)   BMI 13.58 kg/m   Outpatient Encounter Medications as of 10/14/2017  Medication Sig  . acetaminophen (TYLENOL) 500 MG tablet Give 1 tablet by mouth before meals for pain  . amLODipine (NORVASC) 10 MG tablet Take 10 mg by mouth daily.  Marland Kitchen levothyroxine (SYNTHROID, LEVOTHROID) 50 MCG tablet TAKE 1 TABLET ONCE DAILY BEFORE BREAKFAST.  . Melatonin 3 MG TABS Take 1 tablet by mouth at bedtime as needed.   No facility-administered encounter medications on file as of  10/14/2017.      SIGNIFICANT DIAGNOSTIC EXAMS  NONE RECENT   Review of Systems  Unable to perform ROS: Other (poor memory )     Physical Exam  Constitutional: No distress.  Cachexia   Neck: No thyromegaly present.  Cardiovascular: Normal rate, regular rhythm and intact distal pulses.  Murmur heard. 1/6  Pulmonary/Chest: Effort normal and breath sounds normal.  Abdominal: Soft. Bowel sounds are normal. She exhibits no distension. There is no tenderness.  Musculoskeletal: She exhibits edema.  2+ bilateral lower extremity edema Has contracture in neck and feet Kyphosis;    Lymphadenopathy:    She has no cervical adenopathy.  Neurological: She is alert.  Skin: Skin is warm and dry. She is not diaphoretic.  Psychiatric: She has a normal mood and affect.     ASSESSMENT/ PLAN:  TODAY:   1. Essential hypertension: is stable will continue norvasc 10 mg daily  2. Hypothyroidism due to acquired atrophy of thyroid: is stable will continue synthroid 50 mcg daily   3. Anemia of chronic disease; stable will monitor  4. Chronic generalized pain: no complaints of pain: will continue tylenol 500 mg four times daily   5. Severe protein calorie malnutrition: cachexia: weight is 79 pounds; will continue supplements as directed and will monitor   Will check cbc; cmp;  tsh   MD is aware of resident's narcotic use and is in agreement with current plan of care. We will attempt to wean resident as apropriate   Ok Edwards NP [facility]  Contact [phone] Monday through Friday 8am- 5pm  After hours call [phone]

## 2017-10-15 LAB — TSH: TSH: 6.03 — AB (ref 0.41–5.90)

## 2017-10-15 LAB — CBC AND DIFFERENTIAL
HCT: 38 (ref 36–46)
HEMOGLOBIN: 12.2 (ref 12.0–16.0)
Neutrophils Absolute: 5
Platelets: 218 (ref 150–399)
WBC: 5.9

## 2017-10-15 LAB — HEPATIC FUNCTION PANEL
ALK PHOS: 100 (ref 25–125)
ALT: 6 — AB (ref 7–35)
AST: 20 (ref 13–35)
Bilirubin, Total: 0.4

## 2017-10-15 LAB — BASIC METABOLIC PANEL
BUN: 16 (ref 4–21)
Creatinine: 0.7 (ref 0.5–1.1)
Glucose: 101
POTASSIUM: 4.1 (ref 3.4–5.3)
Sodium: 137 (ref 137–147)

## 2017-10-18 ENCOUNTER — Encounter: Payer: Self-pay | Admitting: Internal Medicine

## 2017-10-18 ENCOUNTER — Non-Acute Institutional Stay (SKILLED_NURSING_FACILITY): Payer: Medicare Other | Admitting: Internal Medicine

## 2017-10-18 DIAGNOSIS — E034 Atrophy of thyroid (acquired): Secondary | ICD-10-CM | POA: Diagnosis not present

## 2017-10-18 DIAGNOSIS — R413 Other amnesia: Secondary | ICD-10-CM

## 2017-10-18 DIAGNOSIS — R2681 Unsteadiness on feet: Secondary | ICD-10-CM | POA: Insufficient documentation

## 2017-10-18 DIAGNOSIS — E43 Unspecified severe protein-calorie malnutrition: Secondary | ICD-10-CM | POA: Insufficient documentation

## 2017-10-18 DIAGNOSIS — I10 Essential (primary) hypertension: Secondary | ICD-10-CM | POA: Diagnosis not present

## 2017-10-18 DIAGNOSIS — R296 Repeated falls: Secondary | ICD-10-CM | POA: Diagnosis not present

## 2017-10-18 DIAGNOSIS — R64 Cachexia: Secondary | ICD-10-CM | POA: Diagnosis not present

## 2017-10-18 NOTE — Progress Notes (Signed)
Patient ID: Katrina Meadows, female   DOB: [birth year], 84 y.o.   MRN: [id number]  Provider:  DR Elmon Kirschner Location:  [facility] Room Number: 211 A Place of Service:  SNF (31)  PCP: Lucky Cowboy, MD Patient Care Team: Lucky Cowboy, MD as PCP - General (Internal Medicine) Lucky Cowboy, MD (Internal Medicine)  Extended Emergency Contact Information Primary Emergency Contact: Mattie Marlin Address: [address]          [place], [place] Home Phone: [phone] Mobile Phone: [phone] Relation: Daughter Secondary Emergency Contact: Shoffner,Billie Address: [address]          [place], [place] [address] [place] Home Phone: [phone] Relation: Grandaughter  Code Status: DNR Goals of Care: Advanced Directive information Advanced Directives 10/18/2017  Does Patient Have a Medical Advance Directive? Yes  Type of Advance Directive Out of facility DNR (pink MOST or yellow form)  Does patient want to make changes to medical advance directive? No - Patient declined  Would patient like information on creating a medical advance directive? -  Pre-existing out of facility DNR order (yellow form or pink MOST form) Yellow form placed in chart (order not valid for inpatient use)      Chief Complaint  Patient presents with  . New Admit To SNF    Admission    HPI: Patient is a 84 y.o. female seen today for admission to SNF from home. She has no concerns. Appetite ok and sleeps well. She reports difficulty with her memory. Nursing reports she has increased agitation in the evenings. No falls since SNF admission. She gets nutritional supplements per facility protocol. She has pictures of a gentleman on her nightstand but does not recall who he is and states "I think I may have dated him".   She reports no FHx of DM and HTN. She has no siblings but has 3 children of whom she is estranged.  HTN - BP  stable on amlodipine  Hypothyroidism - takes levothyroxine. TSH 6.03. Unsure if pt was taking all meds as ordered prior to SNF admission  Insomnia - takes melatonin. Nursing reports she has increased agitation at night.   Joint pain, multiple - stable on tylenol ES TID  Cerebral palsy - stable. She has seen neurology in the past. She is w/c bound  SNF labs from 10/15/17 reviewed: TSH 6.03; albumin 3.9; Cr 0.71; other labs stable  Past Medical History:  Diagnosis Date  . Allergy   . Ankle instability    both   . Cerebral palsy ([facility])   . Hyperlipidemia   . Hypertension   . Mumps   . Pre-diabetes   . Sacral fracture ([facility]) 2009  . Scoliosis   . Thyroid disease    hypothyroid  . Tobacco dependence    Past Surgical History:  Procedure Laterality Date  . ABDOMINAL AORTIC ANEURYSM REPAIR    . ILIAC ARTERY STENT Left 2009   with fem/fem bypass    reports that she has been smoking cigarettes.  She has a 99.00 pack-year smoking history. She has never used smokeless tobacco. She reports that she does not drink alcohol or use drugs. Social History   Socioeconomic History  . Marital status: Divorced    Spouse name: Not on file  . Number of children: Not on file  . Years of education: Not on file  . Highest education level: Not on file  Occupational History  . Not on file  Social Needs  . Financial resource strain: Not on file  . Food insecurity:    Worry: Not on file    Inability: Not on file  . Transportation needs:    Medical: Not on file    Non-medical: Not on file  Tobacco Use  . Smoking status: Current Every Day Smoker    Packs/day: 1.50    Years: 66.00    Pack years: 99.00    Types: Cigarettes  . Smokeless tobacco: Never Used  Substance and Sexual Activity  . Alcohol use: No  . Drug use: No  . Sexual activity: Not on file  Lifestyle  . Physical activity:    Days per week: Not on file    Minutes per session: Not on file  . Stress: Not on file  Relationships    . Social connections:    Talks on phone: Not on file    Gets together: Not on file    Attends religious service: Not on file    Active member of club or organization: Not on file    Attends meetings of clubs or organizations: Not on file    Relationship status: Not on file  . Intimate partner violence:    Fear of current or ex partner: Not on file    Emotionally abused: Not on file    Physically abused: Not on file    Forced sexual activity: Not on file  Other Topics Concern  . Not on file  Social History Narrative   ** Merged History Encounter **        Functional Status Survey:    Family History  Problem Relation Age of Onset  . Alzheimer's disease Mother   . Stroke Father   . Heart attack Father   . Heart disease Father   . Alzheimer's disease Father   . Depression Daughter   . Breast cancer Maternal Aunt   . Cancer Maternal Aunt   . Colon cancer Maternal Aunt   . Heart disease Paternal Grandfather     Health Maintenance  Topic Date Due  . INFLUENZA VACCINE  01/06/2018  . DEXA SCAN  Discontinued  . TETANUS/TDAP  Discontinued  . PNA vac Low Risk Adult  Discontinued    Allergies  Allergen Reactions  . Ace Inhibitors Other (See Comments)    unknown  . Chocolate   . Clindamycin/Lincomycin Other (See Comments)    unsure  . Codeine Other (See Comments)    Could not walk, barely coming to  . Copper-Containing Compounds   . Lotrel [Amlodipine Besy-Benazepril Hcl] Other (See Comments)    unknown  . Nickel Other (See Comments)    unknown  . Sulfa Antibiotics Swelling    Outpatient Encounter Medications as of 10/18/2017  Medication Sig  . acetaminophen (TYLENOL) 500 MG tablet Give 1 tablet by mouth before meals for pain  . amLODipine (NORVASC) 10 MG tablet Take 10 mg by mouth daily.  Marland Kitchen levothyroxine (SYNTHROID, LEVOTHROID) 50 MCG tablet TAKE 1 TABLET ONCE DAILY BEFORE BREAKFAST.  . Melatonin 3 MG TABS Take 1 tablet by mouth at bedtime as needed.  .  Nutritional Supplements (NUTRITIONAL SUPPLEMENT PO) Frozen Nutritional Treat - Give by mouth two times daily for weight loss  . Nutritional Supplements (NUTRITIONAL SUPPLEMENT PO) HSG Regular Diet - Regular - Chopped Meat Texture, Regular / thin consistency, whole milk to all meal trays   No facility-administered encounter medications on file as of 10/18/2017.     Review of Systems  Unable to  perform ROS: Other (memory loss)    Vitals:   10/18/17 0916  Pulse: 86  Resp: 18  Temp: (!) 97.1 F (36.2 C)  SpO2: 97%  Weight: 79 lb 1.6 oz (35.9 kg)  Height:  (1.626 m)   Body mass index is 13.58 kg/m. Physical Exam  Constitutional: She appears cachectic.  Frail appearing in NAD, sitting in w/c  HENT:  Mouth/Throat: Oropharynx is clear and moist. No oropharyngeal exudate.  MMM; no oral thrush  Eyes: Pupils are equal, round, and reactive to light. No scleral icterus.  Neck: Neck supple. Carotid bruit is not present. No tracheal deviation present. No thyromegaly present.  Cardiovascular: Normal rate, regular rhythm and intact distal pulses. Exam reveals no gallop and no friction rub.  Murmur heard.  Systolic murmur is present with a grade of 1/6. No LE edema b/l. no calf TTP.   Pulmonary/Chest: Effort normal and breath sounds normal. No stridor. No respiratory distress. She has no wheezes. She has no rales.  Abdominal: Soft. Normal appearance and bowel sounds are normal. She exhibits no distension and no mass. There is no hepatomegaly. There is no tenderness. There is no rigidity, no rebound and no guarding. No hernia.  Musculoskeletal: She exhibits edema (small and large joints) and deformity (thoracic kyphosis).  Lymphadenopathy:    She has no cervical adenopathy.  Neurological: She is alert. She has normal reflexes.  Skin: Skin is warm and dry. No rash noted.  Psychiatric: She has a normal mood and affect. Her behavior is normal. Thought content normal.    Labs  reviewed: Basic Metabolic Panel: Recent Labs    05/19/17 1316 10/15/17  NA 138 137  K 3.9 4.1  CL 100*  --   CO2 28  --   GLUCOSE 105*  --   BUN 17 16  CREATININE 0.71 0.7  CALCIUM 9.0  --    Liver Function Tests: Recent Labs    05/19/17 1316 10/15/17  AST 24 20  ALT 11* 6*  ALKPHOS 86 100  BILITOT 1.0  --   PROT 6.9  --   ALBUMIN 3.6  --    No results for input(s): LIPASE, AMYLASE in the last 8760 hours. No results for input(s): AMMONIA in the last 8760 hours. CBC: Recent Labs    05/19/17 1316 10/15/17  WBC 7.9 5.9  NEUTROABS 6.3 5  HGB 14.1 12.2  HCT 43.2 38  MCV 91.3  --   PLT 163 218   Cardiac Enzymes: No results for input(s): CKTOTAL, CKMB, CKMBINDEX, TROPONINI in the last 8760 hours. BNP: Invalid input(s): POCBNP No results found for: HGBA1C Lab Results  Component Value Date   TSH 6.03 (A) 10/15/2017   No results found for: VITAMINB12 No results found for: FOLATE No results found for: IRON, TIBC, FERRITIN  Imaging and Procedures obtained prior to SNF admission: Dg Chest 2 View  Result Date: 05/19/2017 CLINICAL DATA:  84 year old female with generalize weakness, falls, altered mental status, foul smelling urine. EXAM: CHEST  2 VIEW COMPARISON:  Chest CTA and chest radiographs 12/20/2013 FINDINGS: semi upright AP and lateral views of the chest. Moderate to severe tortuosity and atherosclerosis of the thoracic aorta. Chronic abdominal aortic endograft. Stable cardiac size and mediastinal contours. Stable lung volumes. No pneumothorax, pleural effusion, pulmonary edema or confluent pulmonary opacity. Osteopenia. Stable visualized osseous structures. Negative visible bowel gas pattern. IMPRESSION: 1.  No acute cardiopulmonary abnormality. 2. Severe Aortic Atherosclerosis (ICD10-I70.0). Electronically Signed   By: Althea Grimmer.D.  On: 05/19/2017 14:30   Ct Head Wo Contrast  Result Date: 05/19/2017 CLINICAL DATA:  Generalized weakness with falls and altered  mental status. EXAM: CT HEAD WITHOUT CONTRAST TECHNIQUE: Contiguous axial images were obtained from the base of the skull through the vertex without intravenous contrast. COMPARISON:  None. FINDINGS: Brain: No mass effect, hemorrhage or extra-axial collection. There is periventricular hypoattenuation compatible with chronic microvascular disease. At the right aspect of the superior falx cerebri there is a meningioma measuring 12 x 7 mm. No abnormality of the adjacent brain. There is no evidence of acute cortical infarct. Gray-white differentiation is preserved. Vascular: Atherosclerotic calcification of the vertebral and internal carotid arteries at the skull base. No hyperdense artery. No abnormal attenuation or expansion of the dural venous sinuses. Skull: No skull fracture. Normal skullbase. Small superior right frontal scalp hematoma. Sinuses/Orbits: Paranasal sinuses are clear. No mastoid or middle ear effusion. Normal orbits. Other: None. IMPRESSION: 1. No acute intracranial abnormality. 2. Chronic ischemic microangiopathic changes. 3. Small right parafalcine meningioma measuring 12 x 7 millimeters without abnormality of the underlying brain. 4. Small right frontal scalp hematoma without underlying skull fracture. Electronically Signed   By: Deatra Robinson M.D.   On: 05/19/2017 14:24    Assessment/Plan   ICD-10-CM   1. Cachexia ([facility]) R64   2. Severe protein-calorie malnutrition ([facility]) E43   3. Essential hypertension I10   4. Hypothyroidism due to acquired atrophy of thyroid E03.4    borderline controlled; unsure of compliance with meds prrior to SNF admission  5. Unsteady gait R26.81   6. Frequent falls R29.6   7. Memory loss R41.3      START TRAZODONE  take 1/2 tab qhs for sleep/agitation  Cont other meds as ordered  PT/OT/ST as ordered  Cont nutritional supplements as ordered  Palliative care referral due to severe protein calorie malnutrition and recurrent falls  GOAL: short term  rehab then long term care. Communicated with pt and nursing.  Labs/tests ordered: pre-albumin next draw; TSH and t4 free in 6 weeks  Valina Maes S. Ancil Linsey  [facility] [address] [place], [place] [address] [phone] Cell (Monday-Friday 8 AM - 5 PM) [phone] After 5 PM and follow prompts

## 2017-10-21 ENCOUNTER — Encounter: Payer: Self-pay | Admitting: Adult Health

## 2017-10-21 ENCOUNTER — Non-Acute Institutional Stay (SKILLED_NURSING_FACILITY): Payer: Medicare Other | Admitting: Adult Health

## 2017-10-21 DIAGNOSIS — E034 Atrophy of thyroid (acquired): Secondary | ICD-10-CM

## 2017-10-21 DIAGNOSIS — I10 Essential (primary) hypertension: Secondary | ICD-10-CM

## 2017-10-21 DIAGNOSIS — D638 Anemia in other chronic diseases classified elsewhere: Secondary | ICD-10-CM

## 2017-10-21 NOTE — Progress Notes (Signed)
Location:   [facility] Room Number: 211 A Place of Service:  SNF (31)   CODE STATUS: DNR  Allergies  Allergen Reactions  . Ace Inhibitors Other (See Comments)    unknown  . Chocolate   . Clindamycin/Lincomycin Other (See Comments)    unsure  . Codeine Other (See Comments)    Could not walk, barely coming to  . Copper-Containing Compounds   . Lotrel [Amlodipine Besy-Benazepril Hcl] Other (See Comments)    unknown  . Nickel Other (See Comments)    unknown  . Sulfa Antibiotics Swelling    Chief Complaint  Patient presents with  . Medical Management of Chronic Issues    Hypertension; hypothyroidism; anemia. Weekly follow up for the first 30 days following hospitalization     HPI:  She is a 84 year old long term resident of this facility being seen for the management of her chronic illnesses: hypertension; hypothyroidism; anemia. She is unable to fully participate in the hpi or ros. There are no reports of changes in appetite; no reports of uncontrolled pain; no reports of sleep disturbance. There are no nursing concerns at this time.   Past Medical History:  Diagnosis Date  . Allergy   . Ankle instability    both   . Cerebral palsy ([facility])   . Hyperlipidemia   . Hypertension   . Mumps   . Pre-diabetes   . Sacral fracture ([facility]) 2009  . Scoliosis   . Thyroid disease    hypothyroid  . Tobacco dependence     Past Surgical History:  Procedure Laterality Date  . ABDOMINAL AORTIC ANEURYSM REPAIR    . ILIAC ARTERY STENT Left 2009   with fem/fem bypass    Social History   Socioeconomic History  . Marital status: Divorced    Spouse name: Not on file  . Number of children: Not on file  . Years of education: Not on file  . Highest education level: Not on file  Occupational History  . Not on file  Social Needs  . Financial resource strain: Not on file  . Food insecurity:    Worry: Not on file    Inability: Not on file  . Transportation needs:      Medical: Not on file    Non-medical: Not on file  Tobacco Use  . Smoking status: Current Every Day Smoker    Packs/day: 1.50    Years: 66.00    Pack years: 99.00    Types: Cigarettes  . Smokeless tobacco: Never Used  Substance and Sexual Activity  . Alcohol use: No  . Drug use: No  . Sexual activity: Not on file  Lifestyle  . Physical activity:    Days per week: Not on file    Minutes per session: Not on file  . Stress: Not on file  Relationships  . Social connections:    Talks on phone: Not on file    Gets together: Not on file    Attends religious service: Not on file    Active member of club or organization: Not on file    Attends meetings of clubs or organizations: Not on file    Relationship status: Not on file  . Intimate partner violence:    Fear of current or ex partner: Not on file    Emotionally abused: Not on file    Physically abused: Not on file    Forced sexual activity: Not on file  Other Topics Concern  .  Not on file  Social History Narrative   ** Merged History Encounter **       Family History  Problem Relation Age of Onset  . Alzheimer's disease Mother   . Stroke Father   . Heart attack Father   . Heart disease Father   . Alzheimer's disease Father   . Depression Daughter   . Breast cancer Maternal Aunt   . Cancer Maternal Aunt   . Colon cancer Maternal Aunt   . Heart disease Paternal Grandfather       VITAL SIGNS BP 118/78   Pulse 86   Temp (!) 97.1 F (36.2 C)   Resp 18   Ht  (1.626 m)   Wt 80 lb 3.2 oz (36.4 kg)   SpO2 97%   BMI 13.77 kg/m   Outpatient Encounter Medications as of 10/21/2017  Medication Sig  . acetaminophen (TYLENOL) 500 MG tablet Give 1 tablet by mouth before meals for pain  . amLODipine (NORVASC) 10 MG tablet Take 10 mg by mouth daily.  Marland Kitchen levothyroxine (SYNTHROID, LEVOTHROID) 50 MCG tablet TAKE 1 TABLET ONCE DAILY BEFORE BREAKFAST.  . Melatonin 3 MG TABS Take 1 tablet by mouth at bedtime as needed.   . Nutritional Supplements (NUTRITIONAL SUPPLEMENT PO) Frozen Nutritional Treat - Give by mouth two times daily for weight loss  . Nutritional Supplements (NUTRITIONAL SUPPLEMENT PO) HSG Regular Diet - Regular - Chopped Meat Texture, Regular / thin consistency, whole milk to all meal trays  . traZODone (DESYREL) 50 MG tablet Give 0.5 tablet by mouth at bedtime   No facility-administered encounter medications on file as of 10/21/2017.      SIGNIFICANT DIAGNOSTIC EXAMS  LABS REVIEWED:   10-15-17: wbc 5.9; hgb 12.2; hct 38; creat 218; glucose 101; bun 16; creat 0.7; k+ 4.1; na++ 137; liver normal albumin 3.9; tsh 6.03 10-19-17: pre-albumin 18     Review of Systems  Unable to perform ROS: Other (poor memory )    Physical Exam  Constitutional: No distress.  Cachexia Muscle wasting   Neck: No thyromegaly present.  Cardiovascular: Normal rate, regular rhythm and intact distal pulses.  Murmur heard. 1/6  Pulmonary/Chest: Breath sounds normal. No respiratory distress.  Abdominal: Soft. Bowel sounds are normal. She exhibits no distension. There is no tenderness.  Musculoskeletal: She exhibits edema.  2+ bilateral lower extremity edema Has contracture in neck and feet Kyphosis;  Lymphadenopathy:    She has no cervical adenopathy.  Neurological: She is alert.  Skin: Skin is warm and dry. She is not diaphoretic.  Psychiatric: She has a normal mood and affect.    ASSESSMENT/ PLAN:  TODAY:   1. Essential hypertension: is stable b/p 118/78  will continue norvasc 10 mg daily  2. Hypothyroidism due to acquired atrophy of thyroid: is stable tsh 6.03 will continue synthroid 50 mcg daily   3. Anemia of chronic disease; stable  Hgb 12.2 will monitor  PREVIOUS   4. Chronic generalized pain: no complaints of pain: will continue tylenol 500 mg three times daily   5. Severe protein calorie malnutrition: cachexia: weight is 80 pounds; albumin 3.9; plre-albumin 18  will continue supplements  as directed and will monitor        MD is aware of resident's narcotic use and is in agreement with current plan of care. We will attempt to wean resident as apropriate   Synthia Innocent NP [facility]  Contact [phone] Monday through Friday 8am- 5pm  After hours call [phone]

## 2017-10-29 ENCOUNTER — Encounter: Payer: Self-pay | Admitting: Adult Health

## 2017-10-29 ENCOUNTER — Non-Acute Institutional Stay (SKILLED_NURSING_FACILITY): Payer: Medicare Other | Admitting: Adult Health

## 2017-10-29 DIAGNOSIS — G8929 Other chronic pain: Secondary | ICD-10-CM | POA: Insufficient documentation

## 2017-10-29 DIAGNOSIS — R52 Pain, unspecified: Secondary | ICD-10-CM | POA: Diagnosis not present

## 2017-10-29 DIAGNOSIS — R64 Cachexia: Secondary | ICD-10-CM

## 2017-10-29 DIAGNOSIS — E43 Unspecified severe protein-calorie malnutrition: Secondary | ICD-10-CM

## 2017-10-29 DIAGNOSIS — D638 Anemia in other chronic diseases classified elsewhere: Secondary | ICD-10-CM

## 2017-10-29 NOTE — Progress Notes (Signed)
Location:   [facility] Room Number: 211 A Place of Service:  SNF (31)   CODE STATUS: DNR  Allergies  Allergen Reactions  . Ace Inhibitors Other (See Comments)    unknown  . Chocolate   . Clindamycin/Lincomycin Other (See Comments)    unsure  . Codeine Other (See Comments)    Could not walk, barely coming to  . Copper-Containing Compounds   . Lotrel [Amlodipine Besy-Benazepril Hcl] Other (See Comments)    unknown  . Nickel Other (See Comments)    unknown  . Sulfa Antibiotics Swelling    Chief Complaint  Patient presents with  . Medical Management of Chronic Issues    Anemia; chronic pain; malnutrition. Weekly follow up for the first 30 days post hospitalization     HPI:  She is a 84 year old long term resident of this facility being seen for the management of her chronic illnesses: anemia; chronic pain; malnutrition. She is unable to participate in the hpi or ros. There are no reports of uncontrolled pain; no changes in appetite; no sleep disturbance. There are no nursing concerns at this time.   Past Medical History:  Diagnosis Date  . Allergy   . Ankle instability    both   . Cerebral palsy (HCC)   . Hyperlipidemia   . Hypertension   . Mumps   . Pre-diabetes   . Sacral fracture (HCC) 2009  . Scoliosis   . Thyroid disease    hypothyroid  . Tobacco dependence     Past Surgical History:  Procedure Laterality Date  . ABDOMINAL AORTIC ANEURYSM REPAIR    . ILIAC ARTERY STENT Left 2009   with fem/fem bypass    Social History   Socioeconomic History  . Marital status: Divorced    Spouse name: Not on file  . Number of children: Not on file  . Years of education: Not on file  . Highest education level: Not on file  Occupational History  . Not on file  Social Needs  . Financial resource strain: Not on file  . Food insecurity:    Worry: Not on file    Inability: Not on file  . Transportation needs:    Medical: Not on file   Non-medical: Not on file  Tobacco Use  . Smoking status: Current Every Day Smoker    Packs/day: 1.50    Years: 66.00    Pack years: 99.00    Types: Cigarettes  . Smokeless tobacco: Never Used  Substance and Sexual Activity  . Alcohol use: No  . Drug use: No  . Sexual activity: Not on file  Lifestyle  . Physical activity:    Days per week: Not on file    Minutes per session: Not on file  . Stress: Not on file  Relationships  . Social connections:    Talks on phone: Not on file    Gets together: Not on file    Attends religious service: Not on file    Active member of club or organization: Not on file    Attends meetings of clubs or organizations: Not on file    Relationship status: Not on file  . Intimate partner violence:    Fear of current or ex partner: Not on file    Emotionally abused: Not on file    Physically abused: Not on file    Forced sexual activity: Not on file  Other Topics Concern  . Not on file  Social History  Narrative   ** Merged History Encounter **       Family History  Problem Relation Age of Onset  . Alzheimer's disease Mother   . Stroke Father   . Heart attack Father   . Heart disease Father   . Alzheimer's disease Father   . Depression Daughter   . Breast cancer Maternal Aunt   . Cancer Maternal Aunt   . Colon cancer Maternal Aunt   . Heart disease Paternal Grandfather       VITAL SIGNS BP 132/80   Pulse 80   Temp 98.6 F (37 C)   Resp 16   Ht  (1.626 m)   Wt 80 lb 3.2 oz (36.4 kg)   SpO2 98%   BMI 13.77 kg/m   Outpatient Encounter Medications as of 10/29/2017  Medication Sig  . acetaminophen (TYLENOL) 500 MG tablet Give 1 tablet by mouth before meals for pain  . amLODipine (NORVASC) 10 MG tablet Take 10 mg by mouth daily.  Marland Kitchen levothyroxine (SYNTHROID, LEVOTHROID) 50 MCG tablet TAKE 1 TABLET ONCE DAILY BEFORE BREAKFAST.  . Melatonin 3 MG TABS Take 1 tablet by mouth at bedtime as needed.  . Nutritional Supplements  (NUTRITIONAL SUPPLEMENT PO) Frozen Nutritional Treat - Give by mouth two times daily for weight loss  . Nutritional Supplements (NUTRITIONAL SUPPLEMENT PO) HSG Regular Diet - Regular - Chopped Meat Texture, Regular / thin consistency, whole milk to all meal trays  . traZODone (DESYREL) 50 MG tablet Give 0.5 tablet by mouth at bedtime   No facility-administered encounter medications on file as of 10/29/2017.      SIGNIFICANT DIAGNOSTIC EXAMS   LABS REVIEWED: PREVIOUS  10-15-17: wbc 5.9; hgb 12.2; hct 38; creat 218; glucose 101; bun 16; creat 0.7; k+ 4.1; na++ 137; liver normal albumin 3.9; tsh 6.03 10-19-17: pre-albumin 18    NO NEW LABS.    Review of Systems  Unable to perform ROS: Other (poor memory )    Physical Exam  Constitutional: No distress.  Cachexia  Muscle wasting   Neck: No thyromegaly present.  Cardiovascular: Normal rate, regular rhythm and intact distal pulses.  Murmur heard. 1/6  Pulmonary/Chest: Effort normal and breath sounds normal. No respiratory distress.  Abdominal: Soft. Bowel sounds are normal. She exhibits no distension.  Musculoskeletal: She exhibits edema.  2+ bilateral lower extremity edema Has contracture in neck and feet Kyphosis;   Lymphadenopathy:    She has no cervical adenopathy.  Neurological: She is alert.  Skin: Skin is warm and dry. She is not diaphoretic.  Psychiatric: She has a normal mood and affect.   ASSESSMENT/ PLAN:  TODAY:   1. Anemia of chronic disease; stable  Hgb 12.2 will monitor  2. Chronic generalized pain: no complaints of pain: will continue tylenol 500 mg three times daily   3. Severe protein calorie malnutrition: cachexia: weight is 80 pounds; albumin 3.9; plre-albumin 18  will continue supplements as directed and will monitor  PREVIOUS    4. Essential hypertension: is stable b/p 132/80  will continue norvasc 10 mg daily  5. Hypothyroidism due to acquired atrophy of thyroid: is stable tsh 6.03 will continue  synthroid 50 mcg daily        MD is aware of resident's narcotic use and is in agreement with current plan of care. We will attempt to wean resident as apropriate   Synthia Innocent NP [facility]  Contact [phone] Monday through Friday 8am- 5pm  After hours call [phone]

## 2017-11-08 ENCOUNTER — Non-Acute Institutional Stay (SKILLED_NURSING_FACILITY): Payer: Medicare Other | Admitting: Adult Health

## 2017-11-08 ENCOUNTER — Encounter: Payer: Self-pay | Admitting: Adult Health

## 2017-11-08 DIAGNOSIS — R64 Cachexia: Secondary | ICD-10-CM | POA: Diagnosis not present

## 2017-11-08 DIAGNOSIS — E034 Atrophy of thyroid (acquired): Secondary | ICD-10-CM

## 2017-11-08 DIAGNOSIS — I10 Essential (primary) hypertension: Secondary | ICD-10-CM | POA: Diagnosis not present

## 2017-11-08 DIAGNOSIS — E43 Unspecified severe protein-calorie malnutrition: Secondary | ICD-10-CM

## 2017-11-08 NOTE — Progress Notes (Signed)
Location:   [facility] Room Number: 211 A Place of Service:  SNF (31)   CODE STATUS: DNR  Allergies  Allergen Reactions  . Ace Inhibitors Other (See Comments)    unknown  . Chocolate   . Clindamycin/Lincomycin Other (See Comments)    unsure  . Codeine Other (See Comments)    Could not walk, barely coming to  . Copper-Containing Compounds   . Lotrel [Amlodipine Besy-Benazepril Hcl] Other (See Comments)    unknown  . Nickel Other (See Comments)    unknown  . Sulfa Antibiotics Swelling    Chief Complaint  Patient presents with  . Medical Management of Chronic Issues    Hypertension; hypothyroidism; cachexia malnutrition. Weekly follow up for the first 30 days post hospitalization     HPI:  She is a 84 year old long term resident of this facility being seen for the management of her chronic illnesses: hypertension; hypothyroidism; malnutrition. She is unable to fully participate in the hpi or ros. There are no reports of uncontrolled pain; no change in appetite; no excessive lethargy. There are no nursing concerns at this time.   Past Medical History:  Diagnosis Date  . Allergy   . Ankle instability    both   . Cerebral palsy (HCC)   . Hyperlipidemia   . Hypertension   . Mumps   . Pre-diabetes   . Sacral fracture (HCC) 2009  . Scoliosis   . Thyroid disease    hypothyroid  . Tobacco dependence     Past Surgical History:  Procedure Laterality Date  . ABDOMINAL AORTIC ANEURYSM REPAIR    . ILIAC ARTERY STENT Left 2009   with fem/fem bypass    Social History   Socioeconomic History  . Marital status: Divorced    Spouse name: Not on file  . Number of children: Not on file  . Years of education: Not on file  . Highest education level: Not on file  Occupational History  . Not on file  Social Needs  . Financial resource strain: Not on file  . Food insecurity:    Worry: Not on file    Inability: Not on file  . Transportation needs:   Medical: Not on file    Non-medical: Not on file  Tobacco Use  . Smoking status: Current Every Day Smoker    Packs/day: 1.50    Years: 66.00    Pack years: 99.00    Types: Cigarettes  . Smokeless tobacco: Never Used  Substance and Sexual Activity  . Alcohol use: No  . Drug use: No  . Sexual activity: Not on file  Lifestyle  . Physical activity:    Days per week: Not on file    Minutes per session: Not on file  . Stress: Not on file  Relationships  . Social connections:    Talks on phone: Not on file    Gets together: Not on file    Attends religious service: Not on file    Active member of club or organization: Not on file    Attends meetings of clubs or organizations: Not on file    Relationship status: Not on file  . Intimate partner violence:    Fear of current or ex partner: Not on file    Emotionally abused: Not on file    Physically abused: Not on file    Forced sexual activity: Not on file  Other Topics Concern  . Not on file  Social History  Narrative   ** Merged History Encounter **       Family History  Problem Relation Age of Onset  . Alzheimer's disease Mother   . Stroke Father   . Heart attack Father   . Heart disease Father   . Alzheimer's disease Father   . Depression Daughter   . Breast cancer Maternal Aunt   . Cancer Maternal Aunt   . Colon cancer Maternal Aunt   . Heart disease Paternal Grandfather       VITAL SIGNS BP 132/80   Pulse 80   Temp 98.6 F (37 C)   Resp 16   Ht 5\' 4"  (1.626 m)   Wt 80 lb 3.2 oz (36.4 kg)   SpO2 98%   BMI 13.77 kg/m   Outpatient Encounter Medications as of 11/08/2017  Medication Sig  . acetaminophen (TYLENOL) 500 MG tablet Give 1 tablet by mouth before meals for pain  . amLODipine (NORVASC) 10 MG tablet Take 10 mg by mouth daily.  Marland Kitchen. levothyroxine (SYNTHROID, LEVOTHROID) 50 MCG tablet TAKE 1 TABLET ONCE DAILY BEFORE BREAKFAST.  . Melatonin 3 MG TABS Take 1 tablet by mouth at bedtime as needed.  .  Nutritional Supplements (NUTRITIONAL SUPPLEMENT PO) Frozen Nutritional Treat - Give by mouth two times daily for weight loss  . Nutritional Supplements (NUTRITIONAL SUPPLEMENT PO) HSG Regular Diet - Regular - Chopped Meat Texture, Regular / thin consistency, whole milk to all meal trays  . traZODone (DESYREL) 50 MG tablet Give 0.5 tablet by mouth at bedtime   No facility-administered encounter medications on file as of 11/08/2017.      SIGNIFICANT DIAGNOSTIC EXAMS  LABS REVIEWED: PREVIOUS  10-15-17: wbc 5.9; hgb 12.2; hct 38; creat 218; glucose 101; bun 16; creat 0.7; k+ 4.1; na++ 137; liver normal albumin 3.9; tsh 6.03 10-19-17: pre-albumin 18    NO NEW LABS.   Review of Systems  Unable to perform ROS: Other (poor memory )   Physical Exam  Constitutional: No distress.  Cachexia muscle wasting   Neck: No thyromegaly present.  Cardiovascular: Normal rate, regular rhythm and intact distal pulses.  Murmur heard. 1/6  Pulmonary/Chest: Effort normal and breath sounds normal. No respiratory distress.  Abdominal: Soft. Bowel sounds are normal. She exhibits no distension. There is no tenderness.  Musculoskeletal: She exhibits edema.  2+ bilateral lower extremity edema Has contracture in neck and feet Kyphosis;  Scoliosis   Lymphadenopathy:    She has no cervical adenopathy.  Neurological: She is alert.  Skin: Skin is warm and dry. She is not diaphoretic.  Psychiatric: She has a normal mood and affect.     ASSESSMENT/ PLAN:  TODAY:   1. Severe protein calorie malnutrition: cachexia: weight is 80 pounds; albumin 3.9; plre-albumin 18  will continue supplements as directed and will monitor  2. Essential hypertension: is stable b/p 132/80  will continue norvasc 10 mg daily  3. Hypothyroidism due to acquired atrophy of thyroid: is stable tsh 6.03 will continue synthroid 50 mcg daily   PREVIOUS    4. Anemia of chronic disease; stable  Hgb 12.2 will monitor  5.  Chronic  generalized pain: no complaints of pain: will continue tylenol 500 mg three times daily   MD is aware of resident's narcotic use and is in agreement with current plan of care. We will attempt to wean resident as apropriate   Synthia Innocenteborah Tondalaya Perren NP [facility]  Contact [phone] Monday through Friday 8am- 5pm  After hours call [phone]

## 2017-11-10 ENCOUNTER — Encounter: Payer: Self-pay | Admitting: Adult Health

## 2017-11-10 NOTE — Progress Notes (Signed)
Location:   [facility] Room Number: 211 A Place of Service:  SNF (31)   CODE STATUS: DNR  Allergies  Allergen Reactions  . Ace Inhibitors Other (See Comments)    unknown  . Chocolate   . Clindamycin/Lincomycin Other (See Comments)    unsure  . Codeine Other (See Comments)    Could not walk, barely coming to  . Copper-Containing Compounds   . Lotrel [Amlodipine Besy-Benazepril Hcl] Other (See Comments)    unknown  . Nickel Other (See Comments)    unknown  . Sulfa Antibiotics Swelling    Chief Complaint  Patient presents with  . Medical Management of Chronic Issues        HPI:    Past Medical History:  Diagnosis Date  . Allergy   . Ankle instability    both   . Cerebral palsy (HCC)   . Hyperlipidemia   . Hypertension   . Mumps   . Pre-diabetes   . Sacral fracture (HCC) 2009  . Scoliosis   . Thyroid disease    hypothyroid  . Tobacco dependence     Past Surgical History:  Procedure Laterality Date  . ABDOMINAL AORTIC ANEURYSM REPAIR    . ILIAC ARTERY STENT Left 2009   with fem/fem bypass    Social History   Socioeconomic History  . Marital status: Divorced    Spouse name: Not on file  . Number of children: Not on file  . Years of education: Not on file  . Highest education level: Not on file  Occupational History  . Not on file  Social Needs  . Financial resource strain: Not on file  . Food insecurity:    Worry: Not on file    Inability: Not on file  . Transportation needs:    Medical: Not on file    Non-medical: Not on file  Tobacco Use  . Smoking status: Current Every Day Smoker    Packs/day: 1.50    Years: 66.00    Pack years: 99.00    Types: Cigarettes  . Smokeless tobacco: Never Used  Substance and Sexual Activity  . Alcohol use: No  . Drug use: No  . Sexual activity: Not on file  Lifestyle  . Physical activity:    Days per week: Not on file    Minutes per session: Not on file  . Stress: Not on file    Relationships  . Social connections:    Talks on phone: Not on file    Gets together: Not on file    Attends religious service: Not on file    Active member of club or organization: Not on file    Attends meetings of clubs or organizations: Not on file    Relationship status: Not on file  . Intimate partner violence:    Fear of current or ex partner: Not on file    Emotionally abused: Not on file    Physically abused: Not on file    Forced sexual activity: Not on file  Other Topics Concern  . Not on file  Social History Narrative   ** Merged History Encounter **       Family History  Problem Relation Age of Onset  . Alzheimer's disease Mother   . Stroke Father   . Heart attack Father   . Heart disease Father   . Alzheimer's disease Father   . Depression Daughter   . Breast cancer Maternal Aunt   . Cancer Maternal Aunt   .  Colon cancer Maternal Aunt   . Heart disease Paternal Grandfather       VITAL SIGNS BP 132/80   Pulse 80   Temp 98.6 F (37 C)   Resp 16   Ht 5\' 4"  (1.626 m)   Wt 80 lb 3.2 oz (36.4 kg)   SpO2 98%   BMI 13.77 kg/m   Outpatient Encounter Medications as of 11/10/2017  Medication Sig  . acetaminophen (TYLENOL) 500 MG tablet Give 1 tablet by mouth before meals for pain  . amLODipine (NORVASC) 10 MG tablet Take 10 mg by mouth daily.  Marland Kitchen levothyroxine (SYNTHROID, LEVOTHROID) 50 MCG tablet TAKE 1 TABLET ONCE DAILY BEFORE BREAKFAST.  . Melatonin 3 MG TABS Take 1 tablet by mouth at bedtime as needed.  . Nutritional Supplements (NUTRITIONAL SUPPLEMENT PO) Frozen Nutritional Treat - Give by mouth two times daily for weight loss  . Nutritional Supplements (NUTRITIONAL SUPPLEMENT PO) HSG Regular Diet - Regular - Chopped Meat Texture, Regular / thin consistency, whole milk to all meal trays  . traZODone (DESYREL) 50 MG tablet Give 0.5 tablet by mouth at bedtime   No facility-administered encounter medications on file as of 11/10/2017.      SIGNIFICANT  DIAGNOSTIC EXAMS       ASSESSMENT/ PLAN:    MD is aware of resident's narcotic use and is in agreement with current plan of care. We will attempt to wean resident as apropriate   Synthia Innocent NP [facility]  Contact [phone] Monday through Friday 8am- 5pm  After hours call [phone]  This encounter was created in error - please disregard.

## 2017-11-29 LAB — TSH: TSH: 2.45 (ref 0.41–5.90)

## 2017-12-14 ENCOUNTER — Non-Acute Institutional Stay (SKILLED_NURSING_FACILITY): Payer: Medicare Other | Admitting: Adult Health

## 2017-12-14 ENCOUNTER — Encounter: Payer: Self-pay | Admitting: Adult Health

## 2017-12-14 DIAGNOSIS — E034 Atrophy of thyroid (acquired): Secondary | ICD-10-CM

## 2017-12-14 DIAGNOSIS — G8929 Other chronic pain: Secondary | ICD-10-CM | POA: Diagnosis not present

## 2017-12-14 DIAGNOSIS — R52 Pain, unspecified: Secondary | ICD-10-CM | POA: Diagnosis not present

## 2017-12-14 DIAGNOSIS — D638 Anemia in other chronic diseases classified elsewhere: Secondary | ICD-10-CM | POA: Diagnosis not present

## 2017-12-14 NOTE — Progress Notes (Signed)
Location:   [facility] Room Number: 211 A Place of Service:  SNF (31)   CODE STATUS: DNR  Allergies  Allergen Reactions  . Ace Inhibitors Other (See Comments)    unknown  . Chocolate   . Clindamycin/Lincomycin Other (See Comments)    unsure  . Codeine Other (See Comments)    Could not walk, barely coming to  . Copper-Containing Compounds   . Lotrel [Amlodipine Besy-Benazepril Hcl] Other (See Comments)    unknown  . Nickel Other (See Comments)    unknown  . Sulfa Antibiotics Swelling    Chief Complaint  Patient presents with  . Medical Management of Chronic Issues    Hypothyroidism; anemia; chronic pain     HPI:  She is a 84 year old long term resident of this facility being seen for the management of her chronic illnesses: hypothyroidism; anemia; chronic pain. She continues to be followed by hospice care. She is unable to fully participate in the hpi or ros. There are no reports of uncontrolled pain; no changes in appetite; no behavioral issues. There are no nursing concerns at this time.   Past Medical History:  Diagnosis Date  . Allergy   . Ankle instability    both   . Cerebral palsy (HCC)   . Hyperlipidemia   . Hypertension   . Mumps   . Pre-diabetes   . Sacral fracture (HCC) 2009  . Scoliosis   . Thyroid disease    hypothyroid  . Tobacco dependence     Past Surgical History:  Procedure Laterality Date  . ABDOMINAL AORTIC ANEURYSM REPAIR    . ILIAC ARTERY STENT Left 2009   with fem/fem bypass    Social History   Socioeconomic History  . Marital status: Divorced    Spouse name: Not on file  . Number of children: Not on file  . Years of education: Not on file  . Highest education level: Not on file  Occupational History  . Not on file  Social Needs  . Financial resource strain: Not on file  . Food insecurity:    Worry: Not on file    Inability: Not on file  . Transportation needs:    Medical: Not on file    Non-medical:  Not on file  Tobacco Use  . Smoking status: Current Every Day Smoker    Packs/day: 1.50    Years: 66.00    Pack years: 99.00    Types: Cigarettes  . Smokeless tobacco: Never Used  Substance and Sexual Activity  . Alcohol use: No  . Drug use: No  . Sexual activity: Not on file  Lifestyle  . Physical activity:    Days per week: Not on file    Minutes per session: Not on file  . Stress: Not on file  Relationships  . Social connections:    Talks on phone: Not on file    Gets together: Not on file    Attends religious service: Not on file    Active member of club or organization: Not on file    Attends meetings of clubs or organizations: Not on file    Relationship status: Not on file  . Intimate partner violence:    Fear of current or ex partner: Not on file    Emotionally abused: Not on file    Physically abused: Not on file    Forced sexual activity: Not on file  Other Topics Concern  . Not on file  Social  History Narrative   ** Merged History Encounter **       Family History  Problem Relation Age of Onset  . Alzheimer's disease Mother   . Stroke Father   . Heart attack Father   . Heart disease Father   . Alzheimer's disease Father   . Depression Daughter   . Breast cancer Maternal Aunt   . Cancer Maternal Aunt   . Colon cancer Maternal Aunt   . Heart disease Paternal Grandfather       VITAL SIGNS BP 128/80   Pulse 80   Temp 97.9 F (36.6 C)   Resp 16   Ht 5\' 4"  (1.626 m)   Wt 80 lb 6.4 oz (36.5 kg)   SpO2 98%   BMI 13.80 kg/m   Outpatient Encounter Medications as of 12/14/2017  Medication Sig  . acetaminophen (TYLENOL) 500 MG tablet Give 1 tablet by mouth before meals for pain  . amLODipine (NORVASC) 10 MG tablet Take 10 mg by mouth daily.  Marland Kitchen levothyroxine (SYNTHROID, LEVOTHROID) 50 MCG tablet TAKE 1 TABLET ONCE DAILY BEFORE BREAKFAST.  . Melatonin 3 MG TABS Take 1 tablet by mouth at bedtime as needed.  . Nutritional Supplements (NUTRITIONAL  SUPPLEMENT PO) Frozen Nutritional Treat - Give by mouth two times daily for weight loss  . Nutritional Supplements (NUTRITIONAL SUPPLEMENT PO) HSG Regular Diet - Regular - Chopped Meat Texture, Regular / thin consistency, whole milk to all meal trays  . traZODone (DESYREL) 50 MG tablet Give 0.5 tablet by mouth at bedtime   No facility-administered encounter medications on file as of 12/14/2017.      SIGNIFICANT DIAGNOSTIC EXAMS  LABS REVIEWED: PREVIOUS  10-15-17: wbc 5.9; hgb 12.2; hct 38; creat 218; glucose 101; bun 16; creat 0.7; k+ 4.1; na++ 137; liver normal albumin 3.9; tsh 6.03 10-19-17: pre-albumin 18    NO NEW LABS.     Review of Systems  Unable to perform ROS: Other (poor memory )    Physical Exam  Constitutional: No distress.  Cachexia   Neck: No thyromegaly present.  Cardiovascular: Normal rate, regular rhythm and intact distal pulses.  Murmur heard. 1/6  Pulmonary/Chest: Effort normal and breath sounds normal. No respiratory distress.  Abdominal: Soft. Bowel sounds are normal. She exhibits no distension. There is no tenderness.  Musculoskeletal: Normal range of motion. She exhibits edema.  1+ bilateral lower extremity edema Has contracture in neck and feet Kyphosis;  Scoliosis    Lymphadenopathy:    She has no cervical adenopathy.  Neurological: She is alert.  Skin: Skin is warm and dry. She is not diaphoretic.  Psychiatric: She has a normal mood and affect.     ASSESSMENT/ PLAN:  TODAY:   1. Anemia of chronic disease; stable  Hgb 12.2 will monitor  2.  Chronic generalized pain: no complaints of pain: will continue tylenol 500 mg three times daily   3. Hypothyroidism due to acquired atrophy of thyroid: is stable tsh 6.03 will continue synthroid 50 mcg daily   PREVIOUS    5. Severe protein calorie malnutrition: cachexia: weight is 80 pounds; albumin 3.9; plre-albumin 18  will continue supplements as directed and will monitor  6. Essential  hypertension: is stable b/p 128/80  will continue norvasc 10 mg daily   MD is aware of resident's narcotic use and is in agreement with current plan of care. We will attempt to wean resident as apropriate   Synthia Innocent NP [facility]  Contact [phone] Monday through Friday  8am- 5pm  After hours call [phone]

## 2017-12-15 ENCOUNTER — Encounter: Payer: Self-pay | Admitting: Adult Health

## 2017-12-15 NOTE — Progress Notes (Signed)
Entered in error

## 2017-12-22 ENCOUNTER — Non-Acute Institutional Stay (SKILLED_NURSING_FACILITY): Payer: Medicare Other | Admitting: Adult Health

## 2017-12-22 ENCOUNTER — Encounter: Payer: Self-pay | Admitting: Adult Health

## 2017-12-22 DIAGNOSIS — R64 Cachexia: Secondary | ICD-10-CM | POA: Diagnosis not present

## 2017-12-22 DIAGNOSIS — G8929 Other chronic pain: Secondary | ICD-10-CM | POA: Diagnosis not present

## 2017-12-22 DIAGNOSIS — R52 Pain, unspecified: Secondary | ICD-10-CM

## 2017-12-22 DIAGNOSIS — E43 Unspecified severe protein-calorie malnutrition: Secondary | ICD-10-CM

## 2017-12-22 NOTE — Progress Notes (Signed)
Location:   [facility] Room Number: 211 A Place of Service:  SNF (31)   CODE STATUS: DNR  Allergies  Allergen Reactions  . Ace Inhibitors Other (See Comments)    unknown  . Chocolate   . Clindamycin/Lincomycin Other (See Comments)    unsure  . Codeine Other (See Comments)    Could not walk, barely coming to  . Copper-Containing Compounds   . Lotrel [Amlodipine Besy-Benazepril Hcl] Other (See Comments)    unknown  . Nickel Other (See Comments)    unknown  . Sulfa Antibiotics Swelling    Chief Complaint  Patient presents with  . Acute Visit    Care Plan Meeting    HPI:  We have come together with the hospice team for her routine care plan meeting. She is unable to participate; there is no family present. She is unable to participate in the hpi or ros. She is having increased difficulty feeding herself; but does not like to be fed. She does like the frozen treats. She is taking her medications; she does continue to get out of bed most days to the wheelchair.   Past Medical History:  Diagnosis Date  . Allergy   . Ankle instability    both   . Cerebral palsy (HCC)   . Hyperlipidemia   . Hypertension   . Mumps   . Pre-diabetes   . Sacral fracture (HCC) 2009  . Scoliosis   . Thyroid disease    hypothyroid  . Tobacco dependence     Past Surgical History:  Procedure Laterality Date  . ABDOMINAL AORTIC ANEURYSM REPAIR    . ILIAC ARTERY STENT Left 2009   with fem/fem bypass    Social History   Socioeconomic History  . Marital status: Divorced    Spouse name: Not on file  . Number of children: Not on file  . Years of education: Not on file  . Highest education level: Not on file  Occupational History  . Not on file  Social Needs  . Financial resource strain: Not on file  . Food insecurity:    Worry: Not on file    Inability: Not on file  . Transportation needs:    Medical: Not on file    Non-medical: Not on file  Tobacco Use  .  Smoking status: Current Every Day Smoker    Packs/day: 1.50    Years: 66.00    Pack years: 99.00    Types: Cigarettes  . Smokeless tobacco: Never Used  Substance and Sexual Activity  . Alcohol use: No  . Drug use: No  . Sexual activity: Not on file  Lifestyle  . Physical activity:    Days per week: Not on file    Minutes per session: Not on file  . Stress: Not on file  Relationships  . Social connections:    Talks on phone: Not on file    Gets together: Not on file    Attends religious service: Not on file    Active member of club or organization: Not on file    Attends meetings of clubs or organizations: Not on file    Relationship status: Not on file  . Intimate partner violence:    Fear of current or ex partner: Not on file    Emotionally abused: Not on file    Physically abused: Not on file    Forced sexual activity: Not on file  Other Topics Concern  . Not on file  Social History Narrative   ** Merged History Encounter **       Family History  Problem Relation Age of Onset  . Alzheimer's disease Mother   . Stroke Father   . Heart attack Father   . Heart disease Father   . Alzheimer's disease Father   . Depression Daughter   . Breast cancer Maternal Aunt   . Cancer Maternal Aunt   . Colon cancer Maternal Aunt   . Heart disease Paternal Grandfather       VITAL SIGNS BP 120/78   Pulse 70   Temp 97.9 F (36.6 C)   Resp 18   Ht 5\' 4"  (1.626 m)   Wt 80 lb (36.3 kg)   SpO2 99%   BMI 13.73 kg/m   Outpatient Encounter Medications as of 12/22/2017  Medication Sig  . acetaminophen (TYLENOL) 500 MG tablet Give 1 tablet by mouth before meals for pain  . amLODipine (NORVASC) 10 MG tablet Take 10 mg by mouth daily.  Marland Kitchen levothyroxine (SYNTHROID, LEVOTHROID) 50 MCG tablet TAKE 1 TABLET ONCE DAILY BEFORE BREAKFAST.  . Melatonin 3 MG TABS Take 1 tablet by mouth at bedtime as needed.  . Nutritional Supplements (NUTRITIONAL SUPPLEMENT PO) Frozen Nutritional Treat -  Give by mouth two times daily for weight loss  . Nutritional Supplements (NUTRITIONAL SUPPLEMENT PO) HSG Regular Diet - Regular - Chopped Meat Texture, Regular / thin consistency, whole milk to all meal trays  . traZODone (DESYREL) 50 MG tablet Give 0.5 tablet by mouth at bedtime   No facility-administered encounter medications on file as of 12/22/2017.      SIGNIFICANT DIAGNOSTIC EXAMS  LABS REVIEWED: PREVIOUS  10-15-17: wbc 5.9; hgb 12.2; hct 38; creat 218; glucose 101; bun 16; creat 0.7; k+ 4.1; na++ 137; liver normal albumin 3.9; tsh 6.03 10-19-17: pre-albumin 18    NO NEW LABS.    Review of Systems  Unable to perform ROS: Other (poor memory )    Physical Exam  Constitutional: No distress.  Cachexia   Neck: No thyromegaly present.  Cardiovascular: Normal rate, regular rhythm and intact distal pulses.  Murmur heard. 1/6  Pulmonary/Chest: Effort normal and breath sounds normal. No respiratory distress.  Abdominal: Soft. Bowel sounds are normal. She exhibits no distension. There is no tenderness.  Musculoskeletal: She exhibits no edema.  1+ bilateral lower extremity edema Has contracture in neck and feet Kyphosis;  Scoliosis     Lymphadenopathy:    She has no cervical adenopathy.  Neurological: She is alert.  Skin: Skin is warm and dry. She is not diaphoretic.  Psychiatric: She has a normal mood and affect.     ASSESSMENT/ PLAN:  TODAY:   1.. Severe protein calorie malnutrition: cachexia:  2. Chronic generalized pain  Will put sandwich on all trays for finger food options Will give her a frozen treat twice daily  Will continue current medications and plan of care  Time spent with patient and staff: 30 minutes; discussed her overall status; medications; nutritional status. Everyone verbalized understanding.    MD is aware of resident's narcotic use and is in agreement with current plan of care. We will attempt to wean resident as apropriate   Katrina Innocent  NP [facility]  Contact [phone] Monday through Friday 8am- 5pm  After hours call [phone]

## 2018-01-12 ENCOUNTER — Non-Acute Institutional Stay (SKILLED_NURSING_FACILITY): Payer: Medicare Other | Admitting: Adult Health

## 2018-01-12 DIAGNOSIS — E43 Unspecified severe protein-calorie malnutrition: Secondary | ICD-10-CM | POA: Diagnosis not present

## 2018-01-12 DIAGNOSIS — I10 Essential (primary) hypertension: Secondary | ICD-10-CM | POA: Diagnosis not present

## 2018-01-12 DIAGNOSIS — E034 Atrophy of thyroid (acquired): Secondary | ICD-10-CM | POA: Diagnosis not present

## 2018-01-13 ENCOUNTER — Encounter: Payer: Self-pay | Admitting: Adult Health

## 2018-01-13 NOTE — Progress Notes (Signed)
Location:   [facility] Room Number: 211 A Place of Service:  SNF (31)   CODE STATUS: DNR  Allergies  Allergen Reactions  . Ace Inhibitors Other (See Comments)    unknown  . Chocolate   . Clindamycin/Lincomycin Other (See Comments)    unsure  . Codeine Other (See Comments)    Could not walk, barely coming to  . Copper-Containing Compounds   . Lotrel [Amlodipine Besy-Benazepril Hcl] Other (See Comments)    unknown  . Nickel Other (See Comments)    unknown  . Sulfa Antibiotics Swelling    Chief Complaint  Patient presents with  . Medical Management of Chronic Issues    Hypothyroidism; malnutrition; hypertension    HPI:  She is a 84 year old long term resident of this facility being seen for the management of her chronic illnesses: hypothyroidism; malnutrition; hypertension. She continues to be followed by hospice care. She is unable to fully participate in the hpi or ros. There are no reports of uncontrolled pain; no changes in appetite; no reports of agitation or anxiety. There are no nursing concerns at this time.   Past Medical History:  Diagnosis Date  . Allergy   . Ankle instability    both   . Cerebral palsy (HCC)   . Hyperlipidemia   . Hypertension   . Mumps   . Pre-diabetes   . Sacral fracture (HCC) 2009  . Scoliosis   . Thyroid disease    hypothyroid  . Tobacco dependence     Past Surgical History:  Procedure Laterality Date  . ABDOMINAL AORTIC ANEURYSM REPAIR    . ILIAC ARTERY STENT Left 2009   with fem/fem bypass    Social History   Socioeconomic History  . Marital status: Divorced    Spouse name: Not on file  . Number of children: Not on file  . Years of education: Not on file  . Highest education level: Not on file  Occupational History  . Not on file  Social Needs  . Financial resource strain: Not on file  . Food insecurity:    Worry: Not on file    Inability: Not on file  . Transportation needs:    Medical: Not  on file    Non-medical: Not on file  Tobacco Use  . Smoking status: Current Every Day Smoker    Packs/day: 1.50    Years: 66.00    Pack years: 99.00    Types: Cigarettes  . Smokeless tobacco: Never Used  Substance and Sexual Activity  . Alcohol use: No  . Drug use: No  . Sexual activity: Not on file  Lifestyle  . Physical activity:    Days per week: Not on file    Minutes per session: Not on file  . Stress: Not on file  Relationships  . Social connections:    Talks on phone: Not on file    Gets together: Not on file    Attends religious service: Not on file    Active member of club or organization: Not on file    Attends meetings of clubs or organizations: Not on file    Relationship status: Not on file  . Intimate partner violence:    Fear of current or ex partner: Not on file    Emotionally abused: Not on file    Physically abused: Not on file    Forced sexual activity: Not on file  Other Topics Concern  . Not on file  Social History Narrative   ** Merged History Encounter **       Family History  Problem Relation Age of Onset  . Alzheimer's disease Mother   . Stroke Father   . Heart attack Father   . Heart disease Father   . Alzheimer's disease Father   . Depression Daughter   . Breast cancer Maternal Aunt   . Cancer Maternal Aunt   . Colon cancer Maternal Aunt   . Heart disease Paternal Grandfather       VITAL SIGNS BP 136/60   Pulse 75   Temp 98.8 F (37.1 C)   Resp 18   Ht 5\' 4"  (1.626 m)   Wt 82 lb 9.6 oz (37.5 kg)   SpO2 92%   BMI 14.18 kg/m   Outpatient Encounter Medications as of 01/12/2018  Medication Sig  . acetaminophen (TYLENOL) 500 MG tablet Give 1 tablet by mouth before meals for pain  . amLODipine (NORVASC) 10 MG tablet Take 10 mg by mouth daily.  Marland Kitchen levothyroxine (SYNTHROID, LEVOTHROID) 50 MCG tablet TAKE 1 TABLET ONCE DAILY BEFORE BREAKFAST.  . Melatonin 3 MG TABS Take 1 tablet by mouth at bedtime as needed.  . Nutritional  Supplements (NUTRITIONAL SUPPLEMENT PO) Frozen Nutritional Treat - Give by mouth two times daily for weight loss  . Nutritional Supplements (NUTRITIONAL SUPPLEMENT PO) HSG Regular Diet - Regular - Chopped Meat Texture, Regular / thin consistency, whole milk to all meal trays  . traZODone (DESYREL) 50 MG tablet Give 0.5 tablet by mouth at bedtime   No facility-administered encounter medications on file as of 01/12/2018.      SIGNIFICANT DIAGNOSTIC EXAMS  LABS REVIEWED: PREVIOUS  10-15-17: wbc 5.9; hgb 12.2; hct 38; creat 218; glucose 101; bun 16; creat 0.7; k+ 4.1; na++ 137; liver normal albumin 3.9; tsh 6.03 10-19-17: pre-albumin 18    NO NEW LABS.   Review of Systems  Unable to perform ROS: Other (poor memory )    Physical Exam  Constitutional: No distress.  cachexia  Neck: No thyromegaly present.  Cardiovascular: Normal rate, regular rhythm and intact distal pulses.  Murmur heard. 1/6  Pulmonary/Chest: Effort normal and breath sounds normal. No respiratory distress.  Abdominal: Soft. Bowel sounds are normal. She exhibits no distension. There is no tenderness.  Musculoskeletal: She exhibits no edema.  trace bilateral lower extremity edema Has contracture in neck and feet Kyphosis;  Scoliosis      Lymphadenopathy:    She has no cervical adenopathy.  Neurological: She is alert.  Skin: Skin is warm and dry. She is not diaphoretic.  Psychiatric: She has a normal mood and affect.     ASSESSMENT/ PLAN:  TODAY:   1. Hypothyroidism due to acquired atrophy of thyroid: is stable tsh 6.03 will continue synthroid 50 mcg daily   2. Severe protein calorie malnutrition: cachexia: weight is 82 pounds; albumin 3.9; plre-albumin 18  will continue supplements as directed and will monitor  3. Essential hypertension: is stable b/p 136/60  will continue norvasc 10 mg daily  PREVIOUS    5. Anemia of chronic disease; stable  Hgb 12.2 will monitor  6.  Chronic generalized pain: no  complaints of pain: will continue tylenol 500 mg three times daily    MD is aware of resident's narcotic use and is in agreement with current plan of care. We will attempt to wean resident as apropriate   Synthia Innocent NP [facility]  Contact [phone] Monday through Friday 8am- 5pm  After hours call [phone]

## 2018-01-13 NOTE — Progress Notes (Signed)
Entered in error

## 2018-01-17 ENCOUNTER — Non-Acute Institutional Stay (SKILLED_NURSING_FACILITY): Payer: Medicare Other

## 2018-01-17 DIAGNOSIS — Z0000 Encounter for general adult medical examination without abnormal findings: Secondary | ICD-10-CM

## 2018-01-17 NOTE — Patient Instructions (Signed)
Katrina Meadows , Thank you for taking time to come for your Medicare Wellness Visit. I appreciate your ongoing commitment to your health goals. Please review the following plan we discussed and let me know if I can assist you in the future.   Screening recommendations/referrals: Colonoscopy excluded, over age 82 Mammogram excluded, over age 82 Bone Density excluded Recommended yearly ophthalmology/optometry visit for glaucoma screening and checkup Recommended yearly dental visit for hygiene and checkup  Vaccinations: Influenza vaccine up to date, due 2019 fall season Pneumococcal vaccine excluded Tdap vaccine excluded Shingles vaccine not in past records    Advanced directives: in chart  Conditions/risks identified: none  Next appointment: Dr. Montez Moritaarter makes rounds   Preventive Care 65 Years and Older, Female Preventive care refers to lifestyle choices and visits with your health care provider that can promote health and wellness. What does preventive care include?  A yearly physical exam. This is also called an annual well check.  Dental exams once or twice a year.  Routine eye exams. Ask your health care provider how often you should have your eyes checked.  Personal lifestyle choices, including:  Daily care of your teeth and gums.  Regular physical activity.  Eating a healthy diet.  Avoiding tobacco and drug use.  Limiting alcohol use.  Practicing safe sex.  Taking low-dose aspirin every day.  Taking vitamin and mineral supplements as recommended by your health care provider. What happens during an annual well check? The services and screenings done by your health care provider during your annual well check will depend on your age, overall health, lifestyle risk factors, and family history of disease. Counseling  Your health care provider may ask you questions about your:  Alcohol use.  Tobacco use.  Drug use.  Emotional well-being.  Home and  relationship well-being.  Sexual activity.  Eating habits.  History of falls.  Memory and ability to understand (cognition).  Work and work Astronomerenvironment.  Reproductive health. Screening  You may have the following tests or measurements:  Height, weight, and BMI.  Blood pressure.  Lipid and cholesterol levels. These may be checked every 5 years, or more frequently if you are over 82 years old.  Skin check.  Lung cancer screening. You may have this screening every year starting at age 82 if you have a 30-pack-year history of smoking and currently smoke or have quit within the past 15 years.  Fecal occult blood test (FOBT) of the stool. You may have this test every year starting at age 82.  Flexible sigmoidoscopy or colonoscopy. You may have a sigmoidoscopy every 5 years or a colonoscopy every 10 years starting at age 82.  Hepatitis C blood test.  Hepatitis B blood test.  Sexually transmitted disease (STD) testing.  Diabetes screening. This is done by checking your blood sugar (glucose) after you have not eaten for a while (fasting). You may have this done every 1-3 years.  Bone density scan. This is done to screen for osteoporosis. You may have this done starting at age 82.  Mammogram. This may be done every 1-2 years. Talk to your health care provider about how often you should have regular mammograms. Talk with your health care provider about your test results, treatment options, and if necessary, the need for more tests. Vaccines  Your health care provider may recommend certain vaccines, such as:  Influenza vaccine. This is recommended every year.  Tetanus, diphtheria, and acellular pertussis (Tdap, Td) vaccine. You may need a Td booster every  10 years.  Zoster vaccine. You may need this after age 5.  Pneumococcal 13-valent conjugate (PCV13) vaccine. One dose is recommended after age 12.  Pneumococcal polysaccharide (PPSV23) vaccine. One dose is recommended after  age 31. Talk to your health care provider about which screenings and vaccines you need and how often you need them. This information is not intended to replace advice given to you by your health care provider. Make sure you discuss any questions you have with your health care provider. Document Released: 06/21/2015 Document Revised: 02/12/2016 Document Reviewed: 03/26/2015 Elsevier Interactive Patient Education  2017 [employer] Prevention in the Home Falls can cause injuries. They can happen to people of all ages. There are many things you can do to make your home safe and to help prevent falls. What can I do on the outside of my home?  Regularly fix the edges of walkways and driveways and fix any cracks.  Remove anything that might make you trip as you walk through a door, such as a raised step or threshold.  Trim any bushes or trees on the path to your home.  Use bright outdoor lighting.  Clear any walking paths of anything that might make someone trip, such as rocks or tools.  Regularly check to see if handrails are loose or broken. Make sure that both sides of any steps have handrails.  Any raised decks and porches should have guardrails on the edges.  Have any leaves, snow, or ice cleared regularly.  Use sand or salt on walking paths during winter.  Clean up any spills in your garage right away. This includes oil or grease spills. What can I do in the bathroom?  Use night lights.  Install grab bars by the toilet and in the tub and shower. Do not use towel bars as grab bars.  Use non-skid mats or decals in the tub or shower.  If you need to sit down in the shower, use a plastic, non-slip stool.  Keep the floor dry. Clean up any water that spills on the floor as soon as it happens.  Remove soap buildup in the tub or shower regularly.  Attach bath mats securely with double-sided non-slip rug tape.  Do not have throw rugs and other things on the floor that can  make you trip. What can I do in the bedroom?  Use night lights.  Make sure that you have a light by your bed that is easy to reach.  Do not use any sheets or blankets that are too big for your bed. They should not hang down onto the floor.  Have a firm chair that has side arms. You can use this for support while you get dressed.  Do not have throw rugs and other things on the floor that can make you trip. What can I do in the kitchen?  Clean up any spills right away.  Avoid walking on wet floors.  Keep items that you use a lot in easy-to-reach places.  If you need to reach something above you, use a strong step stool that has a grab bar.  Keep electrical cords out of the way.  Do not use floor polish or wax that makes floors slippery. If you must use wax, use non-skid floor wax.  Do not have throw rugs and other things on the floor that can make you trip. What can I do with my stairs?  Do not leave any items on the stairs.  Make sure  that there are handrails on both sides of the stairs and use them. Fix handrails that are broken or loose. Make sure that handrails are as long as the stairways.  Check any carpeting to make sure that it is firmly attached to the stairs. Fix any carpet that is loose or worn.  Avoid having throw rugs at the top or bottom of the stairs. If you do have throw rugs, attach them to the floor with carpet tape.  Make sure that you have a light switch at the top of the stairs and the bottom of the stairs. If you do not have them, ask someone to add them for you. What else can I do to help prevent falls?  Wear shoes that:  Do not have high heels.  Have rubber bottoms.  Are comfortable and fit you well.  Are closed at the toe. Do not wear sandals.  If you use a stepladder:  Make sure that it is fully opened. Do not climb a closed stepladder.  Make sure that both sides of the stepladder are locked into place.  Ask someone to hold it for you,  if possible.  Clearly mark and make sure that you can see:  Any grab bars or handrails.  First and last steps.  Where the edge of each step is.  Use tools that help you move around (mobility aids) if they are needed. These include:  Canes.  Walkers.  Scooters.  Crutches.  Turn on the lights when you go into a dark area. Replace any light bulbs as soon as they burn out.  Set up your furniture so you have a clear path. Avoid moving your furniture around.  If any of your floors are uneven, fix them.  If there are any pets around you, be aware of where they are.  Review your medicines with your doctor. Some medicines can make you feel dizzy. This can increase your chance of falling. Ask your doctor what other things that you can do to help prevent falls. This information is not intended to replace advice given to you by your health care provider. Make sure you discuss any questions you have with your health care provider. Document Released: 03/21/2009 Document Revised: 10/31/2015 Document Reviewed: 06/29/2014 Elsevier Interactive Patient Education  2017 [employer].

## 2018-01-17 NOTE — Progress Notes (Signed)
Subjective:   Katrina Meadows is a 84 y.o. female who presents for Medicare Annual (Subsequent) preventive examination at [facility]  Last AWV-01/06/2017    Objective:     Vitals: BP 135/62 (BP Location: Left Arm, Patient Position: Sitting)   Pulse 76   Temp 98.6 F (37 C) (Oral)   Ht 5\' 4"  (1.626 m)   Wt 82 lb 9.6 oz (37.5 kg)   BMI 14.18 kg/m   Body mass index is 14.18 kg/m.  Advanced Directives 01/17/2018 01/13/2018 12/22/2017 12/15/2017 12/14/2017 11/10/2017 11/08/2017  Does Patient Have a Medical Advance Directive? Yes Yes Yes Yes Yes Yes Yes  Type of Advance Directive Out of facility DNR (pink MOST or yellow form) Out of facility DNR (pink MOST or yellow form) Out of facility DNR (pink MOST or yellow form) Out of facility DNR (pink MOST or yellow form) Out of facility DNR (pink MOST or yellow form) Out of facility DNR (pink MOST or yellow form) Out of facility DNR (pink MOST or yellow form)  Does patient want to make changes to medical advance directive? No - Patient declined No - Patient declined No - Patient declined No - Patient declined No - Patient declined No - Patient declined No - Patient declined  Would patient like information on creating a medical advance directive? - - - - - - -  Pre-existing out of facility DNR order (yellow form or pink MOST form) Yellow form placed in chart (order not valid for inpatient use) Yellow form placed in chart (order not valid for inpatient use) Yellow form placed in chart (order not valid for inpatient use) Yellow form placed in chart (order not valid for inpatient use) Yellow form placed in chart (order not valid for inpatient use) Yellow form placed in chart (order not valid for inpatient use) Yellow form placed in chart (order not valid for inpatient use)    Tobacco Social History   Tobacco Use  Smoking Status Current Every Day Smoker  . Packs/day: 1.50  . Years: 66.00  . Pack years: 99.00  . Types: Cigarettes  Smokeless  Tobacco Never Used     Ready to quit: Not Answered Counseling given: Not Answered   Clinical Intake:  Pre-visit preparation completed: No  Pain : No/denies pain     Nutritional Risks: None Diabetes: No  How often do you need to have someone help you when you read instructions, pamphlets, or other written materials from your doctor or pharmacy?: 3 - Sometimes  Interpreter Needed?: No  Information entered by :: Tyron RussellSara Alanny Rivers, RN  Past Medical History:  Diagnosis Date  . Allergy   . Ankle instability    both   . Cerebral palsy (HCC)   . Hyperlipidemia   . Hypertension   . Mumps   . Pre-diabetes   . Sacral fracture (HCC) 2009  . Scoliosis   . Thyroid disease    hypothyroid  . Tobacco dependence    Past Surgical History:  Procedure Laterality Date  . ABDOMINAL AORTIC ANEURYSM REPAIR    . ILIAC ARTERY STENT Left 2009   with fem/fem bypass   Family History  Problem Relation Age of Onset  . Alzheimer's disease Mother   . Stroke Father   . Heart attack Father   . Heart disease Father   . Alzheimer's disease Father   . Depression Daughter   . Breast cancer Maternal Aunt   . Cancer Maternal Aunt   . Colon cancer Maternal  Aunt   . Heart disease Paternal Grandfather    Social History   Socioeconomic History  . Marital status: Divorced    Spouse name: Not on file  . Number of children: Not on file  . Years of education: Not on file  . Highest education level: Not on file  Occupational History  . Not on file  Social Needs  . Financial resource strain: Not hard at all  . Food insecurity:    Worry: Never true    Inability: Never true  . Transportation needs:    Medical: No    Non-medical: No  Tobacco Use  . Smoking status: Current Every Day Smoker    Packs/day: 1.50    Years: 66.00    Pack years: 99.00    Types: Cigarettes  . Smokeless tobacco: Never Used  Substance and Sexual Activity  . Alcohol use: No  . Drug use: No  . Sexual activity: Not on  file  Lifestyle  . Physical activity:    Days per week: 0 days    Minutes per session: 0 min  . Stress: Not at all  Relationships  . Social connections:    Talks on phone: Three times a week    Gets together: Three times a week    Attends religious service: Never    Active member of club or organization: No    Attends meetings of clubs or organizations: Never    Relationship status: Divorced  Other Topics Concern  . Not on file  Social History Narrative   ** Merged History Encounter **        Outpatient Encounter Medications as of 01/17/2018  Medication Sig  . acetaminophen (TYLENOL) 500 MG tablet Give 1 tablet by mouth before meals for pain  . amLODipine (NORVASC) 10 MG tablet Take 10 mg by mouth daily.  Marland Kitchen. levothyroxine (SYNTHROID, LEVOTHROID) 50 MCG tablet TAKE 1 TABLET ONCE DAILY BEFORE BREAKFAST.  . Melatonin 3 MG TABS Take 1 tablet by mouth at bedtime as needed.  . Nutritional Supplements (NUTRITIONAL SUPPLEMENT PO) Frozen Nutritional Treat - Give by mouth two times daily for weight loss  . Nutritional Supplements (NUTRITIONAL SUPPLEMENT PO) HSG Regular Diet - Regular - Chopped Meat Texture, Regular / thin consistency, whole milk to all meal trays  . traZODone (DESYREL) 50 MG tablet Give 0.5 tablet by mouth at bedtime   No facility-administered encounter medications on file as of 01/17/2018.     Activities of Daily Living In your present state of health, do you have any difficulty performing the following activities: 01/17/2018  Hearing? N  Vision? N  Difficulty concentrating or making decisions? Y  Walking or climbing stairs? Y  Dressing or bathing? Y  Doing errands, shopping? Y  Preparing Food and eating ? Y  Using the Toilet? Y  In the past six months, have you accidently leaked urine? Y  Do you have problems with loss of bowel control? Y  Managing your Medications? Y  Managing your Finances? Y  Housekeeping or managing your Housekeeping? Y  Some recent data  might be hidden    Patient Care Team: Lucky CowboyMcKeown, William, MD as PCP - General (Internal Medicine) Lucky CowboyMcKeown, William, MD (Internal Medicine)    Assessment:   This is a routine wellness examination for Cheris.  Exercise Activities and Dietary recommendations Current Exercise Habits: The patient does not participate in regular exercise at present, Exercise limited by: orthopedic condition(s)  Goals   None     Fall Risk Fall  Risk  01/17/2018  Falls in the past year? No   Is the patient's home free of loose throw rugs in walkways, pet beds, electrical cords, etc?   yes      Grab bars in the bathroom? yes      Handrails on the stairs?   yes      Adequate lighting?   yes  Depression Screen PHQ 2/9 Scores 01/17/2018  PHQ - 2 Score 0     Cognitive Function     6CIT Screen 01/17/2018  What Year? 0 points  What month? 3 points  What time? 3 points  Count back from 20 0 points  Months in reverse 4 points  Repeat phrase 10 points  Total Score 20    Immunization History  Administered Date(s) Administered  . DT 10/29/2003  . Influenza Whole 03/31/2013  . PPD Test 12/29/2017  . Pneumococcal Polysaccharide-23 01/22/2009  . Zoster 03/31/2012    Qualifies for Shingles Vaccine? Not in past records  Screening Tests Health Maintenance  Topic Date Due  . INFLUENZA VACCINE  04/15/2018 (Originally 01/06/2018)  . DEXA SCAN  Discontinued  . TETANUS/TDAP  Discontinued  . PNA vac Low Risk Adult  Discontinued    Cancer Screenings: Lung: Low Dose CT Chest recommended if Age 18-80 years, 30 pack-year currently smoking OR have quit w/in 15years. Patient does qualify. Breast:  Up to date on Mammogram? Yes   Up to date of Bone Density/Dexa? excluded Colorectal: up to date  Additional Screenings: Hepatitis C Screening: declined    Plan:    I have personally reviewed and addressed the Medicare Annual Wellness questionnaire and have noted the following in the patient's chart:  A. Medical  and social history B. Use of alcohol, tobacco or illicit drugs  C. Current medications and supplements D. Functional ability and status E.  Nutritional status F.  Physical activity G. Advance directives H. List of other physicians I.  Hospitalizations, surgeries, and ER visits in previous 12 months J.  Vitals K. Screenings to include hearing, vision, cognitive, depression L. Referrals and appointments - none  In addition, I have reviewed and discussed with patient certain preventive protocols, quality metrics, and best practice recommendations. A written personalized care plan for preventive services as well as general preventive health recommendations were provided to patient.  See attached scanned questionnaire for additional information.   Signed,   Tyron Russell, RN Nurse Health Advisor  Patient Concerns: None

## 2018-01-20 NOTE — Progress Notes (Signed)
error 

## 2018-02-03 ENCOUNTER — Non-Acute Institutional Stay (SKILLED_NURSING_FACILITY): Payer: Medicare Other | Admitting: Adult Health

## 2018-02-03 ENCOUNTER — Encounter: Payer: Self-pay | Admitting: Adult Health

## 2018-02-03 DIAGNOSIS — R52 Pain, unspecified: Secondary | ICD-10-CM | POA: Diagnosis not present

## 2018-02-03 DIAGNOSIS — I7410 Embolism and thrombosis of unspecified parts of aorta: Secondary | ICD-10-CM

## 2018-02-03 DIAGNOSIS — R64 Cachexia: Secondary | ICD-10-CM

## 2018-02-03 DIAGNOSIS — E43 Unspecified severe protein-calorie malnutrition: Secondary | ICD-10-CM

## 2018-02-03 DIAGNOSIS — G8929 Other chronic pain: Secondary | ICD-10-CM

## 2018-02-03 NOTE — Progress Notes (Signed)
Location:   [facility] Room Number: 211 A Place of Service:  SNF (31)   CODE STATUS: DNR  Allergies  Allergen Reactions  . Ace Inhibitors Other (See Comments)    unknown  . Chocolate   . Clindamycin/Lincomycin Other (See Comments)    unsure  . Codeine Other (See Comments)    Could not walk, barely coming to  . Copper-Containing Compounds   . Lotrel [Amlodipine Besy-Benazepril Hcl] Other (See Comments)    unknown  . Nickel Other (See Comments)    unknown  . Sulfa Antibiotics Swelling    Chief Complaint  Patient presents with  . Acute Visit    Care Plan Meeting    HPI:  We have come together with the hospice team for her care plan meeting. She is unable to participate in the meeting. There is no family present. She is declining globally; she is spending more time in her room. She is taking a long time to eat; she would benefit from finger type foods. She will not allow anyone to feed her. There are no reports of uncontrolled pain; no agitation; no poor appetite. Hospice is requesting that some blood work be drawn.    Past Medical History:  Diagnosis Date  . Allergy   . Ankle instability    both   . Cerebral palsy (HCC)   . Hyperlipidemia   . Hypertension   . Mumps   . Pre-diabetes   . Sacral fracture (HCC) 2009  . Scoliosis   . Thyroid disease    hypothyroid  . Tobacco dependence     Past Surgical History:  Procedure Laterality Date  . ABDOMINAL AORTIC ANEURYSM REPAIR    . ILIAC ARTERY STENT Left 2009   with fem/fem bypass    Social History   Socioeconomic History  . Marital status: Divorced    Spouse name: Not on file  . Number of children: Not on file  . Years of education: Not on file  . Highest education level: Not on file  Occupational History  . Not on file  Social Needs  . Financial resource strain: Not hard at all  . Food insecurity:    Worry: Never true    Inability: Never true  . Transportation needs:    Medical: No     Non-medical: No  Tobacco Use  . Smoking status: Current Every Day Smoker    Packs/day: 1.50    Years: 66.00    Pack years: 99.00    Types: Cigarettes  . Smokeless tobacco: Never Used  Substance and Sexual Activity  . Alcohol use: No  . Drug use: No  . Sexual activity: Not on file  Lifestyle  . Physical activity:    Days per week: 0 days    Minutes per session: 0 min  . Stress: Not at all  Relationships  . Social connections:    Talks on phone: Three times a week    Gets together: Three times a week    Attends religious service: Never    Active member of club or organization: No    Attends meetings of clubs or organizations: Never    Relationship status: Divorced  . Intimate partner violence:    Fear of current or ex partner: No    Emotionally abused: No    Physically abused: No    Forced sexual activity: No  Other Topics Concern  . Not on file  Social History Narrative   ** Merged History Encounter **  Family History  Problem Relation Age of Onset  . Alzheimer's disease Mother   . Stroke Father   . Heart attack Father   . Heart disease Father   . Alzheimer's disease Father   . Depression Daughter   . Breast cancer Maternal Aunt   . Cancer Maternal Aunt   . Colon cancer Maternal Aunt   . Heart disease Paternal Grandfather       VITAL SIGNS BP 132/80   Pulse 68   Temp (!) 97.3 F (36.3 C)   Resp 16   Ht 5\' 4"  (1.626 m)   Wt 82 lb 9.6 oz (37.5 kg)   SpO2 98%   BMI 14.18 kg/m   Outpatient Encounter Medications as of 02/03/2018  Medication Sig  . acetaminophen (TYLENOL) 500 MG tablet Give 1 tablet by mouth before meals for pain  . amLODipine (NORVASC) 10 MG tablet Take 10 mg by mouth daily.  Marland Kitchen. levothyroxine (SYNTHROID, LEVOTHROID) 50 MCG tablet TAKE 1 TABLET ONCE DAILY BEFORE BREAKFAST.  . Melatonin 3 MG TABS Take 1 tablet by mouth at bedtime as needed.  . Nutritional Supplements (NUTRITIONAL SUPPLEMENT PO) Frozen Nutritional Treat - Give 4 oz  by mouth two times daily for weight loss  . Nutritional Supplements (NUTRITIONAL SUPPLEMENT PO) HSG Regular Diet - Regular - Chopped Meat Texture, Regular / thin consistency, whole milk to all meal trays  . traZODone (DESYREL) 50 MG tablet Give 0.5 tablet by mouth at bedtime   No facility-administered encounter medications on file as of 02/03/2018.      SIGNIFICANT DIAGNOSTIC EXAMS  LABS REVIEWED: PREVIOUS  10-15-17: wbc 5.9; hgb 12.2; hct 38; creat 218; glucose 101; bun 16; creat 0.7; k+ 4.1; na++ 137; liver normal albumin 3.9; tsh 6.03 10-19-17: pre-albumin 18    NO NEW LABS.   Review of Systems  Unable to perform ROS: Dementia (confusion )    Physical Exam  Constitutional: No distress.  Cachexia   Neck: No thyromegaly present.  Cardiovascular: Normal rate, regular rhythm and intact distal pulses.  Murmur heard. 1/6  Pulmonary/Chest: Effort normal and breath sounds normal. No respiratory distress.  Abdominal: Soft. Bowel sounds are normal. She exhibits no distension. There is no tenderness.  Musculoskeletal: She exhibits edema.  trace bilateral lower extremity edema Has contracture in neck and feet Kyphosis;  Scoliosis       Lymphadenopathy:    She has no cervical adenopathy.  Neurological: She is alert.  Skin: Skin is warm and dry. She is not diaphoretic.  Psychiatric: She has a normal mood and affect.     ASSESSMENT/ PLAN:  TODAY:   1. Severe protein calorie malnutrition: cachexia: 2.  Chronic generalized pain:  3. Aortic mural thrombus  Will continue her current medications Will have dietary provide her with finger foods Will check cbc with diff; albumin and tsh    MD is aware of resident's narcotic use and is in agreement with current plan of care. We will attempt to wean resident as apropriate   Synthia Innocenteborah Green NP [facility]  Contact [phone] Monday through Friday 8am- 5pm  After hours call [phone]

## 2018-02-04 LAB — CBC AND DIFFERENTIAL
HCT: 30 — AB (ref 36–46)
HCT: 30 — AB (ref 36–46)
Hemoglobin: 9.2 — AB (ref 12.0–16.0)
Hemoglobin: 9.2 — AB (ref 12.0–16.0)
Neutrophils Absolute: 5
Neutrophils Absolute: 5
PLATELETS: 225 (ref 150–399)
Platelets: 225 (ref 150–399)
WBC: 5.7
WBC: 5.7

## 2018-02-04 LAB — TSH
TSH: 2.99 (ref 0.41–5.90)
TSH: 2.99 (ref 0.41–5.90)

## 2018-02-10 ENCOUNTER — Encounter: Payer: Self-pay | Admitting: Adult Health

## 2018-02-10 ENCOUNTER — Non-Acute Institutional Stay (SKILLED_NURSING_FACILITY): Payer: Medicare Other | Admitting: Adult Health

## 2018-02-10 DIAGNOSIS — F0150 Vascular dementia without behavioral disturbance: Secondary | ICD-10-CM | POA: Diagnosis not present

## 2018-02-10 DIAGNOSIS — D638 Anemia in other chronic diseases classified elsewhere: Secondary | ICD-10-CM

## 2018-02-10 DIAGNOSIS — F5101 Primary insomnia: Secondary | ICD-10-CM

## 2018-02-10 DIAGNOSIS — R52 Pain, unspecified: Secondary | ICD-10-CM

## 2018-02-10 DIAGNOSIS — G8929 Other chronic pain: Secondary | ICD-10-CM

## 2018-02-10 NOTE — Progress Notes (Signed)
Location:   [facility] Room Number: 211 A Place of Service:  SNF (31)   CODE STATUS: DNR  Allergies  Allergen Reactions  . Ace Inhibitors Other (See Comments)    unknown  . Chocolate   . Clindamycin/Lincomycin Other (See Comments)    unsure  . Codeine Other (See Comments)    Could not walk, barely coming to  . Copper-Containing Compounds   . Lotrel [Amlodipine Besy-Benazepril Hcl] Other (See Comments)    unknown  . Nickel Other (See Comments)    unknown  . Sulfa Antibiotics Swelling    Chief Complaint  Patient presents with  . Medical Management of Chronic Issues    Anemia; pain; insomnia; dementia.     HPI:  She is a 84 year old long term resident of this facility being seen for the management of her chronic illnesses: anemia; pain; insomnia; dementia. She continues to be followed by hospice care. She is unable to participate in the hpi or ros. There are no reports of uncontrolled pain; no changes in appetite; no insomnia. There are no nursing concerns at this time.    Past Medical History:  Diagnosis Date  . Allergy   . Ankle instability    both   . Cerebral palsy (HCC)   . Hyperlipidemia   . Hypertension   . Mumps   . Pre-diabetes   . Sacral fracture (HCC) 2009  . Scoliosis   . Thyroid disease    hypothyroid  . Tobacco dependence     Past Surgical History:  Procedure Laterality Date  . ABDOMINAL AORTIC ANEURYSM REPAIR    . ILIAC ARTERY STENT Left 2009   with fem/fem bypass    Social History   Socioeconomic History  . Marital status: Divorced    Spouse name: Not on file  . Number of children: Not on file  . Years of education: Not on file  . Highest education level: Not on file  Occupational History  . Not on file  Social Needs  . Financial resource strain: Not hard at all  . Food insecurity:    Worry: Never true    Inability: Never true  . Transportation needs:    Medical: No    Non-medical: No  Tobacco Use  . Smoking  status: Current Every Day Smoker    Packs/day: 1.50    Years: 66.00    Pack years: 99.00    Types: Cigarettes  . Smokeless tobacco: Never Used  Substance and Sexual Activity  . Alcohol use: No  . Drug use: No  . Sexual activity: Not on file  Lifestyle  . Physical activity:    Days per week: 0 days    Minutes per session: 0 min  . Stress: Not at all  Relationships  . Social connections:    Talks on phone: Three times a week    Gets together: Three times a week    Attends religious service: Never    Active member of club or organization: No    Attends meetings of clubs or organizations: Never    Relationship status: Divorced  . Intimate partner violence:    Fear of current or ex partner: No    Emotionally abused: No    Physically abused: No    Forced sexual activity: No  Other Topics Concern  . Not on file  Social History Narrative   ** Merged History Encounter **       Family History  Problem Relation Age of  Onset  . Alzheimer's disease Mother   . Stroke Father   . Heart attack Father   . Heart disease Father   . Alzheimer's disease Father   . Depression Daughter   . Breast cancer Maternal Aunt   . Cancer Maternal Aunt   . Colon cancer Maternal Aunt   . Heart disease Paternal Grandfather       VITAL SIGNS BP 112/68   Pulse 64   Temp (!) 97.5 F (36.4 C)   Resp 16   Ht 5\' 4"  (1.626 m)   Wt 82 lb 9.6 oz (37.5 kg)   SpO2 97%   BMI 14.18 kg/m   Outpatient Encounter Medications as of 02/10/2018  Medication Sig  . acetaminophen (TYLENOL) 500 MG tablet Give 1 tablet by mouth before meals for pain  . amLODipine (NORVASC) 10 MG tablet Take 10 mg by mouth daily.  Marland Kitchen levothyroxine (SYNTHROID, LEVOTHROID) 50 MCG tablet TAKE 1 TABLET ONCE DAILY BEFORE BREAKFAST.  . Melatonin 3 MG TABS Take 1 tablet by mouth at bedtime as needed.  . Nutritional Supplements (NUTRITIONAL SUPPLEMENT PO) Frozen Nutritional Treat - Give 4 oz by mouth two times daily for weight loss  .  Nutritional Supplements (NUTRITIONAL SUPPLEMENT PO) HSG Regular Diet - Regular - Chopped Meat Texture, Regular / thin consistency, whole milk to all meal trays  . traZODone (DESYREL) 50 MG tablet Give 0.5 tablet by mouth at bedtime   No facility-administered encounter medications on file as of 02/10/2018.      SIGNIFICANT DIAGNOSTIC EXAMS   LABS REVIEWED: PREVIOUS  10-15-17: wbc 5.9; hgb 12.2; hct 38; creat 218; glucose 101; bun 16; creat 0.7; k+ 4.1; na++ 137; liver normal albumin 3.9; tsh 6.03 10-19-17: pre-albumin 18   TODAY:   02-04-18: wbc 5.7; hgb 9.2; hct 30.3; mcv 75.4; plt 225; tsh 2.99; albumin 4.1     Review of Systems  Unable to perform ROS: Dementia (confusion )    Physical Exam  Constitutional: No distress.  Cachexia   Neck: No thyromegaly present.  Cardiovascular: Normal rate, regular rhythm and intact distal pulses.  Murmur heard. 1/6  Pulmonary/Chest: Effort normal and breath sounds normal. No respiratory distress.  Abdominal: Soft. Bowel sounds are normal. She exhibits no distension. There is no tenderness.  Musculoskeletal: She exhibits edema.  trace bilateral lower extremity edema Has contracture in neck and feet Kyphosis;  Scoliosis  Lymphadenopathy:    She has no cervical adenopathy.  Neurological: She is alert.  Skin: Skin is warm and dry. She is not diaphoretic.  Psychiatric: She has a normal mood and affect.     ASSESSMENT/ PLAN:  TODAY:   1. Anemia of chronic disease; hgb is worse hgb is 9.2 will not make changes  will monitor  2.  Chronic generalized pain: no complaints of pain: will continue tylenol 500 mg three times daily   3. Insomnia: is stable will continue trazodone 25 mg nightly   4.  Vascular dementia without behavioral disturbance: is without change weight is 82 pounds will not make changes will monitor   PREVIOUS    5. Hypothyroidism due to acquired atrophy of thyroid: is stable tsh 2.99 will continue synthroid 50 mcg daily     6. Severe protein calorie malnutrition: cachexia: weight is 82 pounds; albumin 4.1; plre-albumin 18  will continue supplements as directed and will monitor  7. Essential hypertension: is stable b/p 112/68  will continue norvasc 10 mg daily    MD is aware of resident's narcotic  use and is in agreement with current plan of care. We will attempt to wean resident as apropriate   Ok Edwards NP [facility]  Contact [phone] Monday through Friday 8am- 5pm  After hours call [phone]

## 2018-02-13 DIAGNOSIS — F0150 Vascular dementia without behavioral disturbance: Secondary | ICD-10-CM | POA: Insufficient documentation

## 2018-02-13 DIAGNOSIS — G4700 Insomnia, unspecified: Secondary | ICD-10-CM | POA: Insufficient documentation

## 2018-03-09 ENCOUNTER — Non-Acute Institutional Stay (SKILLED_NURSING_FACILITY): Payer: Medicare Other | Admitting: Adult Health

## 2018-03-09 ENCOUNTER — Other Ambulatory Visit: Payer: Self-pay

## 2018-03-09 ENCOUNTER — Encounter: Payer: Self-pay | Admitting: Adult Health

## 2018-03-09 DIAGNOSIS — E43 Unspecified severe protein-calorie malnutrition: Secondary | ICD-10-CM

## 2018-03-09 DIAGNOSIS — E034 Atrophy of thyroid (acquired): Secondary | ICD-10-CM

## 2018-03-09 DIAGNOSIS — I10 Essential (primary) hypertension: Secondary | ICD-10-CM | POA: Diagnosis not present

## 2018-03-09 MED ORDER — ALPRAZOLAM 0.25 MG PO TABS: 0.2500 mg | ORAL_TABLET | Freq: Two times a day (BID) | ORAL | 0 refills | Status: AC | PRN

## 2018-03-09 NOTE — Progress Notes (Signed)
Location:   [facility] Room Number: 211 A Place of Service:  SNF (31)   CODE STATUS: DNR  Allergies  Allergen Reactions  . Ace Inhibitors Other (See Comments)    unknown  . Chocolate   . Clindamycin/Lincomycin Other (See Comments)    unsure  . Codeine Other (See Comments)    Could not walk, barely coming to  . Copper-Containing Compounds   . Lotrel [Amlodipine Besy-Benazepril Hcl] Other (See Comments)    unknown  . Nickel Other (See Comments)    unknown  . Sulfa Antibiotics Swelling    Chief Complaint  Patient presents with  . Medical Management of Chronic Issues    Hypothyroidism; hypertension; malnutrition     HPI:  She is a 84 year old long term resident of this facility being seen for the management of her chronic illnesses: hypothyroidism; hypertension; protein calorie malnutrition. She is unable to participate in the hpi or ros. There are no reports of uncontrolled pain; no changes in appetite; does have anxiety present.   Past Medical History:  Diagnosis Date  . Allergy   . Ankle instability    both   . Cerebral palsy (HCC)   . Hyperlipidemia   . Hypertension   . Mumps   . Pre-diabetes   . Sacral fracture (HCC) 2009  . Scoliosis   . Thyroid disease    hypothyroid  . Tobacco dependence     Past Surgical History:  Procedure Laterality Date  . ABDOMINAL AORTIC ANEURYSM REPAIR    . ILIAC ARTERY STENT Left 2009   with fem/fem bypass    Social History   Socioeconomic History  . Marital status: Divorced    Spouse name: Not on file  . Number of children: Not on file  . Years of education: Not on file  . Highest education level: Not on file  Occupational History  . Not on file  Social Needs  . Financial resource strain: Not hard at all  . Food insecurity:    Worry: Never true    Inability: Never true  . Transportation needs:    Medical: No    Non-medical: No  Tobacco Use  . Smoking status: Current Every Day Smoker   Packs/day: 1.50    Years: 66.00    Pack years: 99.00    Types: Cigarettes  . Smokeless tobacco: Never Used  Substance and Sexual Activity  . Alcohol use: No  . Drug use: No  . Sexual activity: Not on file  Lifestyle  . Physical activity:    Days per week: 0 days    Minutes per session: 0 min  . Stress: Not at all  Relationships  . Social connections:    Talks on phone: Three times a week    Gets together: Three times a week    Attends religious service: Never    Active member of club or organization: No    Attends meetings of clubs or organizations: Never    Relationship status: Divorced  . Intimate partner violence:    Fear of current or ex partner: No    Emotionally abused: No    Physically abused: No    Forced sexual activity: No  Other Topics Concern  . Not on file  Social History Narrative   ** Merged History Encounter **       Family History  Problem Relation Age of Onset  . Alzheimer's disease Mother   . Stroke Father   . Heart attack Father   .  Heart disease Father   . Alzheimer's disease Father   . Depression Daughter   . Breast cancer Maternal Aunt   . Cancer Maternal Aunt   . Colon cancer Maternal Aunt   . Heart disease Paternal Grandfather       VITAL SIGNS BP 116/66   Pulse 66   Temp 97.8 F (36.6 C)   Resp 17   Ht 5\' 4"  (1.626 m)   Wt 86 lb (39 kg)   SpO2 96%   BMI 14.76 kg/m   Outpatient Encounter Medications as of 03/09/2018  Medication Sig  . acetaminophen (TYLENOL) 500 MG tablet Give 1 tablet by mouth before meals for pain  . amLODipine (NORVASC) 10 MG tablet Take 10 mg by mouth daily.  Marland Kitchen levothyroxine (SYNTHROID, LEVOTHROID) 50 MCG tablet TAKE 1 TABLET ONCE DAILY BEFORE BREAKFAST.  Marland Kitchen Nutritional Supplements (NUTRITIONAL SUPPLEMENT PO) Frozen Nutritional Treat - Give 4 oz by mouth two times daily for weight loss  . Nutritional Supplements (NUTRITIONAL SUPPLEMENT PO) HSG Regular Diet - Regular - Chopped Meat Texture  . traZODone  (DESYREL) 50 MG tablet Give 0.5 tablet by mouth at bedtime  . [DISCONTINUED] Melatonin 3 MG TABS Take 1 tablet by mouth at bedtime as needed.   No facility-administered encounter medications on file as of 03/09/2018.      SIGNIFICANT DIAGNOSTIC EXAMS  LABS REVIEWED: PREVIOUS  10-15-17: wbc 5.9; hgb 12.2; hct 38; creat 218; glucose 101; bun 16; creat 0.7; k+ 4.1; na++ 137; liver normal albumin 3.9; tsh 6.03 10-19-17: pre-albumin 18  02-04-18: wbc 5.7; hgb 9.2; hct 30.3; mcv 75.4; plt 225; tsh 2.99; albumin 4.1    NO NEW LABS.    Review of Systems  Unable to perform ROS: Dementia (confusion )    Physical Exam  Constitutional: No distress.  Cachexia   Neck: No thyromegaly present.  Cardiovascular: Normal rate, regular rhythm and intact distal pulses.  Murmur heard. 1/6  Pulmonary/Chest: Effort normal and breath sounds normal. No respiratory distress.  Abdominal: Soft. Bowel sounds are normal. She exhibits no distension. There is no tenderness.  Musculoskeletal: She exhibits edema.  trace bilateral lower extremity edema Has contracture in neck and feet Kyphosis;  Scoliosis   Lymphadenopathy:    She has no cervical adenopathy.  Neurological: She is alert.  Skin: Skin is warm and dry. She is not diaphoretic.  Psychiatric:  Is easily agitated     ASSESSMENT/ PLAN:  TODAY:   1. Hypothyroidism due to acquired atrophy of thyroid: is stable tsh 2.99 will continue synthroid 50 mcg daily   2. Severe protein calorie malnutrition: cachexia: weight is 82 pounds; albumin 4.1; pre-albumin 18  will continue supplements as directed and will monitor  3. Essential hypertension: is stable b/p 112/68  will continue norvasc 10 mg daily  4. Agitation: is worse: will begin xanax 0.25 mg twice daily as needed for the next 14 days.   PREVIOUS    5. Anemia of chronic disease; hgb is without change  hgb is 9.2 will not make changes  will monitor  6.  Chronic generalized pain: no complaints of  pain: will continue tylenol 500 mg three times daily   7. Insomnia: is stable will continue trazodone 25 mg nightly   8.  Vascular dementia without behavioral disturbance: is without change weight is 82 pounds will not make changes will monitor       MD is aware of resident's narcotic use and is in agreement with current plan of care. We  will attempt to wean resident as apropriate   Synthia Innocent NP [facility]  Contact [phone] Monday through Friday 8am- 5pm  After hours call [phone]

## 2018-03-09 NOTE — Telephone Encounter (Signed)
Rx faxed to [facility] (P) [phone], (F) [phone] 

## 2018-03-18 LAB — CBC AND DIFFERENTIAL
HEMATOCRIT: 30 — AB (ref 36–46)
HEMOGLOBIN: 9.1 — AB (ref 12.0–16.0)
Neutrophils Absolute: 5
PLATELETS: 232 (ref 150–399)
WBC: 6.7

## 2018-03-18 LAB — BASIC METABOLIC PANEL
BUN: 16 (ref 4–21)
Creatinine: 0.5 (ref 0.5–1.1)
Glucose: 149
Potassium: 4.7 (ref 3.4–5.3)
Sodium: 144 (ref 137–147)

## 2018-03-18 LAB — HEPATIC FUNCTION PANEL
ALK PHOS: 85 (ref 25–125)
ALT: 9 (ref 7–35)
AST: 14 (ref 13–35)
Bilirubin, Total: 0.2

## 2018-06-20 ENCOUNTER — Emergency Department (HOSPITAL_COMMUNITY): Payer: Medicare Other

## 2018-06-20 ENCOUNTER — Encounter (HOSPITAL_COMMUNITY): Payer: Self-pay | Admitting: Emergency Medicine

## 2018-06-20 ENCOUNTER — Observation Stay (HOSPITAL_COMMUNITY)
Admission: EM | Admit: 2018-06-20 | Discharge: 2018-06-22 | Disposition: A | Discharge status: Home / Self Care | Payer: Medicare Other | Attending: Internal Medicine | Admitting: Internal Medicine

## 2018-06-20 ENCOUNTER — Other Ambulatory Visit: Payer: Self-pay

## 2018-06-20 DIAGNOSIS — E43 Unspecified severe protein-calorie malnutrition: Secondary | ICD-10-CM | POA: Diagnosis not present

## 2018-06-20 DIAGNOSIS — E034 Atrophy of thyroid (acquired): Secondary | ICD-10-CM | POA: Diagnosis not present

## 2018-06-20 DIAGNOSIS — R718 Other abnormality of red blood cells: Secondary | ICD-10-CM | POA: Diagnosis present

## 2018-06-20 DIAGNOSIS — D649 Anemia, unspecified: Principal | ICD-10-CM | POA: Diagnosis present

## 2018-06-20 DIAGNOSIS — Z79899 Other long term (current) drug therapy: Secondary | ICD-10-CM | POA: Diagnosis not present

## 2018-06-20 DIAGNOSIS — I10 Essential (primary) hypertension: Secondary | ICD-10-CM | POA: Diagnosis present

## 2018-06-20 DIAGNOSIS — E039 Hypothyroidism, unspecified: Secondary | ICD-10-CM | POA: Diagnosis present

## 2018-06-20 DIAGNOSIS — F0150 Vascular dementia without behavioral disturbance: Secondary | ICD-10-CM | POA: Diagnosis present

## 2018-06-20 LAB — CBC WITH DIFFERENTIAL/PLATELET
ABS IMMATURE GRANULOCYTES: 0.01 10*3/uL (ref 0.00–0.07)
Basophils Absolute: 0 10*3/uL (ref 0.0–0.1)
Basophils Relative: 0 %
Eosinophils Absolute: 0.2 10*3/uL (ref 0.0–0.5)
Eosinophils Relative: 3 %
HCT: 25.9 % — ABNORMAL LOW (ref 36.0–46.0)
Hemoglobin: 6.6 g/dL — CL (ref 12.0–15.0)
Immature Granulocytes: 0 %
Lymphocytes Relative: 18 %
Lymphs Abs: 1 10*3/uL (ref 0.7–4.0)
MCH: 18.6 pg — ABNORMAL LOW (ref 26.0–34.0)
MCHC: 25.5 g/dL — ABNORMAL LOW (ref 30.0–36.0)
MCV: 73 fL — ABNORMAL LOW (ref 80.0–100.0)
Monocytes Absolute: 0.6 10*3/uL (ref 0.1–1.0)
Monocytes Relative: 10 %
NEUTROS PCT: 69 %
Neutro Abs: 3.9 10*3/uL (ref 1.7–7.7)
Platelets: 275 10*3/uL (ref 150–400)
RBC: 3.55 MIL/uL — ABNORMAL LOW (ref 3.87–5.11)
RDW: 17.2 % — ABNORMAL HIGH (ref 11.5–15.5)
WBC: 5.7 10*3/uL (ref 4.0–10.5)
nRBC: 0 % (ref 0.0–0.2)

## 2018-06-20 LAB — URINALYSIS, ROUTINE W REFLEX MICROSCOPIC
Bilirubin Urine: NEGATIVE
Glucose, UA: NEGATIVE mg/dL
Hgb urine dipstick: NEGATIVE
Ketones, ur: NEGATIVE mg/dL
Leukocytes, UA: NEGATIVE
Nitrite: NEGATIVE
Protein, ur: NEGATIVE mg/dL
Specific Gravity, Urine: 1.018 (ref 1.005–1.030)
pH: 5 (ref 5.0–8.0)

## 2018-06-20 LAB — COMPREHENSIVE METABOLIC PANEL
ALT: 10 U/L (ref 0–44)
AST: 13 U/L — ABNORMAL LOW (ref 15–41)
Albumin: 3.6 g/dL (ref 3.5–5.0)
Alkaline Phosphatase: 50 U/L (ref 38–126)
Anion gap: 10 (ref 5–15)
BUN: 20 mg/dL (ref 8–23)
CO2: 27 mmol/L (ref 22–32)
Calcium: 8.9 mg/dL (ref 8.9–10.3)
Chloride: 104 mmol/L (ref 98–111)
Creatinine, Ser: 0.73 mg/dL (ref 0.44–1.00)
GFR calc Af Amer: >60 mL/min (ref >60)
GFR calc non Af Amer: >60 mL/min (ref >60)
Glucose, Bld: 137 mg/dL — ABNORMAL HIGH (ref 70–99)
POTASSIUM: 3.6 mmol/L (ref 3.5–5.1)
SODIUM: 141 mmol/L (ref 135–145)
Total Bilirubin: 0.7 mg/dL (ref 0.3–1.2)
Total Protein: 6.5 g/dL (ref 6.5–8.1)

## 2018-06-20 LAB — IRON AND TIBC
Iron: 11 ug/dL — ABNORMAL LOW (ref 28–170)
SATURATION RATIOS: 3 % — AB (ref 10.4–31.8)
TIBC: 412 ug/dL (ref 250–450)
UIBC: 401 ug/dL

## 2018-06-20 LAB — TROPONIN I: Troponin I: <0.03 ng/mL (ref <0.03)

## 2018-06-20 LAB — FERRITIN: Ferritin: 6 ng/mL — ABNORMAL LOW (ref 11–307)

## 2018-06-20 LAB — RETICULOCYTES
Immature Retic Fract: 21.2 % — ABNORMAL HIGH (ref 2.3–15.9)
RBC.: 3.4 MIL/uL — ABNORMAL LOW (ref 3.87–5.11)
Retic Count, Absolute: 55.4 10*3/uL (ref 19.0–186.0)
Retic Ct Pct: 1.6 % (ref 0.4–3.1)

## 2018-06-20 LAB — FOLATE: Folate: 11.4 ng/mL (ref >5.9)

## 2018-06-20 LAB — POC OCCULT BLOOD, ED: Fecal Occult Bld: NEGATIVE

## 2018-06-20 LAB — PREPARE RBC (CROSSMATCH)

## 2018-06-20 LAB — VITAMIN B12: Vitamin B-12: 486 pg/mL (ref 180–914)

## 2018-06-20 MED ORDER — SODIUM CHLORIDE 0.9 % IV SOLN
10.0000 mL/h | Freq: Once | INTRAVENOUS | Status: AC
  Administered 2018-06-20: 10 mL/h via INTRAVENOUS

## 2018-06-20 MED ORDER — DOCUSATE SODIUM 100 MG PO CAPS
100.0000 mg | ORAL_CAPSULE | Freq: Every day | ORAL | Status: DC
  Administered 2018-06-21 – 2018-06-22 (×2): 100 mg via ORAL
  Filled 2018-06-20 (×2): qty 1

## 2018-06-20 MED ORDER — DIVALPROEX SODIUM 125 MG PO DR TAB
125.0000 mg | DELAYED_RELEASE_TABLET | Freq: Three times a day (TID) | ORAL | Status: DC
  Administered 2018-06-21 – 2018-06-22 (×6): 125 mg via ORAL
  Filled 2018-06-20 (×7): qty 1

## 2018-06-20 MED ORDER — ENOXAPARIN SODIUM 40 MG/0.4ML ~~LOC~~ SOLN
40.0000 mg | Freq: Every day | SUBCUTANEOUS | Status: DC
  Administered 2018-06-21: 40 mg via SUBCUTANEOUS
  Filled 2018-06-20: qty 0.4

## 2018-06-20 MED ORDER — ONDANSETRON HCL 4 MG/2ML IJ SOLN: 4.0000 mg | Freq: Four times a day (QID) | INTRAMUSCULAR | Status: DC | PRN

## 2018-06-20 MED ORDER — ONDANSETRON HCL 4 MG PO TABS: 4.0000 mg | ORAL_TABLET | Freq: Four times a day (QID) | ORAL | Status: DC | PRN

## 2018-06-20 MED ORDER — TRAZODONE HCL 50 MG PO TABS
25.0000 mg | ORAL_TABLET | Freq: Every day | ORAL | Status: DC
  Administered 2018-06-21 (×2): 25 mg via ORAL
  Filled 2018-06-20 (×2): qty 1

## 2018-06-20 MED ORDER — AMLODIPINE BESYLATE 10 MG PO TABS
10.0000 mg | ORAL_TABLET | Freq: Every day | ORAL | Status: DC
  Administered 2018-06-21 – 2018-06-22 (×2): 10 mg via ORAL
  Filled 2018-06-20 (×2): qty 1

## 2018-06-20 MED ORDER — ACETAMINOPHEN 325 MG PO TABS: 650.0000 mg | ORAL_TABLET | Freq: Four times a day (QID) | ORAL | Status: DC | PRN

## 2018-06-20 MED ORDER — FERROUS SULFATE 325 (65 FE) MG PO TABS
325.0000 mg | ORAL_TABLET | Freq: Every day | ORAL | Status: DC
  Administered 2018-06-21 – 2018-06-22 (×2): 325 mg via ORAL
  Filled 2018-06-20 (×2): qty 1

## 2018-06-20 MED ORDER — ACETAMINOPHEN 650 MG RE SUPP: 650.0000 mg | Freq: Four times a day (QID) | RECTAL | Status: DC | PRN

## 2018-06-20 MED ORDER — LEVOTHYROXINE SODIUM 50 MCG PO TABS
50.0000 ug | ORAL_TABLET | Freq: Every day | ORAL | Status: DC
  Administered 2018-06-21 – 2018-06-22 (×2): 50 ug via ORAL
  Filled 2018-06-20 (×2): qty 1

## 2018-06-20 NOTE — H&P (Signed)
History and Physical    Katrina Pimplenn L Kopinski WUJ:[id number] DOB: February 12, 1929 DOA: 06/20/2018  PCP: Lucky CowboyMcKeown, William, MD  Patient coming from: Skilled nursing facility.  Chief Complaint: Low hemoglobin.  HPI: Katrina Meadows is a 85 y.o. female with history of hypertension, hypothyroidism, chronic anemia, depression anxiety was referred to the ER after patient's hemoglobin noticed in a routine lab work to be around 6.  Patient states she does not have any chest pain shortness of breath dizziness loss of consciousness or noticed any blood in the stools or did not have any vomiting or any blood or any hematuria.  ED Course: In the ER patient's hemoglobin was confirmed to be around 6.6 and stool for occult blood was negative per the ER physician.  Anemia panel shows ferritin of 6 TIBC of 412 consistent with iron deficiency anemia.  Patient's med reconciliation shows that patient is already on ferrous sulfate despite which patient's hemoglobin is low.  Patient has been ordered 1 unit of PRBC admitted for further observation.  Review of Systems: As per HPI, rest all negative.   Past Medical History:  Diagnosis Date  . Allergy   . Ankle instability    both   . Cerebral palsy (HCC)   . Hyperlipidemia   . Hypertension   . Mumps   . Pre-diabetes   . Sacral fracture (HCC) 2009  . Scoliosis   . Thyroid disease    hypothyroid  . Tobacco dependence     Past Surgical History:  Procedure Laterality Date  . ABDOMINAL AORTIC ANEURYSM REPAIR    . ILIAC ARTERY STENT Left 2009   with fem/fem bypass     reports that she has been smoking cigarettes. She has a 99.00 pack-year smoking history. She has never used smokeless tobacco. She reports that she does not drink alcohol or use drugs.  Allergies  Allergen Reactions  . Ace Inhibitors Other (See Comments)    unknown  . Chocolate   . Clindamycin/Lincomycin Other (See Comments)    unsure  . Codeine Other (See Comments)    Could not walk, barely  coming to  . Copper-Containing Compounds   . Lotrel [Amlodipine Besy-Benazepril Hcl] Other (See Comments)    unknown  . Nickel Other (See Comments)    unknown  . Sulfa Antibiotics Swelling    Family History  Problem Relation Age of Onset  . Alzheimer's disease Mother   . Stroke Father   . Heart attack Father   . Heart disease Father   . Alzheimer's disease Father   . Depression Daughter   . Breast cancer Maternal Aunt   . Cancer Maternal Aunt   . Colon cancer Maternal Aunt   . Heart disease Paternal Grandfather     Prior to Admission medications   Medication Sig Start Date End Date Taking? Authorizing Provider  acetaminophen (TYLENOL) 500 MG tablet Give 1 tablet by mouth before meals for pain   Yes [provider]  amLODipine (NORVASC) 10 MG tablet Take 10 mg by mouth daily.   Yes [provider]  divalproex (DEPAKOTE) 125 MG DR tablet Take 125 mg by mouth 3 (three) times daily.   Yes [provider]  docusate sodium (COLACE) 100 MG capsule Take 100 mg by mouth daily.   Yes [provider]  Ferrous Sulfate (IRON) 325 (65 Fe) MG TABS Take 1 tablet by mouth daily.   Yes [provider]  guaiFENesin (ROBITUSSIN MUCUS+CHEST CONGEST) 100 MG/5ML liquid Take 200 mg by mouth  every 6 (six) hours as needed for cough.   Yes [provider]  levothyroxine (SYNTHROID, LEVOTHROID) 50 MCG tablet TAKE 1 TABLET ONCE DAILY BEFORE BREAKFAST. 01/11/15  Yes Lucky Cowboy, MD  Nutritional Supplements (NUTRITIONAL SUPPLEMENT PO) Frozen Nutritional Treat - Give 4 oz by mouth two times daily for weight loss 10/18/17  Yes [provider]  traZODone (DESYREL) 50 MG tablet Take 25 mg by mouth at bedtime.  10/18/17  Yes [provider]  ALPRAZolam (XANAX) 0.25 MG tablet Take 1 tablet (0.25 mg total) by mouth 2 (two) times daily as needed for anxiety. Patient not taking: Reported on 06/20/2018 03/09/18   Katrina Holster, NP    Physical  Exam: Vitals:   06/20/18 1849 06/20/18 2142  BP: 139/69 (!) 154/66  Pulse: 87 84  Resp: 18 17  Temp: 97.6 F (36.4 C) 98.7 F (37.1 C)  TempSrc: Oral Oral  SpO2: 98% 95%      Constitutional: Moderately built and nourished. Vitals:   06/20/18 1849 06/20/18 2142  BP: 139/69 (!) 154/66  Pulse: 87 84  Resp: 18 17  Temp: 97.6 F (36.4 C) 98.7 F (37.1 C)  TempSrc: Oral Oral  SpO2: 98% 95%   Eyes: Anicteric mild pallor. ENMT: No discharge from the ears eyes nose or mouth. Neck: No mass felt.  No neck rigidity. Respiratory: No rhonchi or crepitations. Cardiovascular: S1-S2 heard. Abdomen: Soft nontender bowel sounds present. Musculoskeletal: No edema.  No joint effusion. Skin: No rash. Neurologic: Alert awake oriented to her name and place.  Moves all extremities. Psychiatric: Oriented to her name and place.   Labs on Admission: I have personally reviewed following labs and imaging studies  CBC: Recent Labs  Lab 06/20/18 1925  WBC 5.7  NEUTROABS 3.9  HGB 6.6*  HCT 25.9*  MCV 73.0*  PLT 275   Basic Metabolic Panel: Recent Labs  Lab 06/20/18 1925  NA 141  K 3.6  CL 104  CO2 27  GLUCOSE 137*  BUN 20  CREATININE 0.73  CALCIUM 8.9   GFR: CrCl cannot be calculated (Unknown ideal weight.). Liver Function Tests: Recent Labs  Lab 06/20/18 1925  AST 13*  ALT 10  ALKPHOS 50  BILITOT 0.7  PROT 6.5  ALBUMIN 3.6   No results for input(s): LIPASE, AMYLASE in the last 168 hours. No results for input(s): AMMONIA in the last 168 hours. Coagulation Profile: No results for input(s): INR, PROTIME in the last 168 hours. Cardiac Enzymes: Recent Labs  Lab 06/20/18 1925  TROPONINI <0.03   BNP (last 3 results) No results for input(s): PROBNP in the last 8760 hours. HbA1C: No results for input(s): HGBA1C in the last 72 hours. CBG: No results for input(s): GLUCAP in the last 168 hours. Lipid Profile: No results for input(s): CHOL, HDL, LDLCALC, TRIG,  CHOLHDL, LDLDIRECT in the last 72 hours. Thyroid Function Tests: No results for input(s): TSH, T4TOTAL, FREET4, T3FREE, THYROIDAB in the last 72 hours. Anemia Panel: Recent Labs    06/20/18 1943  VITAMINB12 486  FOLATE 11.4  FERRITIN 6*  TIBC 412  IRON 11*  RETICCTPCT 1.6   Urine analysis:    Component Value Date/Time   COLORURINE YELLOW 05/19/2017 1519   APPEARANCEUR CLEAR 05/19/2017 1519   LABSPEC 1.021 05/19/2017 1519   PHURINE 5.0 05/19/2017 1519   GLUCOSEU NEGATIVE 05/19/2017 1519   HGBUR MODERATE (A) 05/19/2017 1519   BILIRUBINUR NEGATIVE 05/19/2017 1519   KETONESUR NEGATIVE 05/19/2017 1519   PROTEINUR 30 (A) 05/19/2017  1519   UROBILINOGEN 0.2 12/20/2013 1143   NITRITE NEGATIVE 05/19/2017 1519   LEUKOCYTESUR NEGATIVE 05/19/2017 1519   Sepsis Labs: @LABRCNTIP (procalcitonin:4,lacticidven:4) )No results found for this or any previous visit (from the past 240 hour(s)).   Radiological Exams on Admission: Dg Chest 2 View  Result Date: 06/20/2018 CLINICAL DATA:  Low hemoglobin.  No patient complaints. EXAM: CHEST - 2 VIEW COMPARISON:  Multiple chest x-rays and CT scans since July 2015. FINDINGS: There is prominence of the thoracic aorta which is been similar on imaging since 2015. No pneumothorax. No nodule, mass, or focal infiltrate. No other acute abnormalities. Anterior wedging of a midthoracic vertebral body is stable since 2015. IMPRESSION: No acute abnormalities to explain the patient's symptoms. Electronically Signed   By: Gerome Sam III M.D   On: 06/20/2018 20:29      Assessment/Plan Principal Problem:   Symptomatic anemia Active Problems:   Essential hypertension   Hypothyroidism   Severe protein-calorie malnutrition (HCC)   Vascular dementia without behavioral disturbance (HCC)    1. Severe iron deficiency anemia -1 unit of PRBC has been ordered repeat CBC.  Will need iron supplements.  Not sure if patient was taking it before because medication  reconciliation shows that he is already on it.  Patient likely will need GI work-up as outpatient for the iron deficiency cause. 2. Hypertension on amlodipine. 3. Hypothyroidism on Synthroid. 4. History of depression anxiety on Xanax Depakote and trazodone.   DVT prophylaxis: Lovenox. Code Status: DNR. Family Communication: No family at the bedside. Disposition Plan: Back to skilled nursing facility. Consults called: None. Admission status: Observation.   Eduard Clos MD [facility] Pager [phone].  If 7PM-7AM, please contact night-coverage [email or web address] Password [facility]  06/20/2018, 10:11 PM

## 2018-06-20 NOTE — ED Provider Notes (Signed)
[facility]-EMERGENCY DEPT Provider Note   CSN: [id number] Arrival date & time: 06/20/18  1837     History   Chief Complaint Chief Complaint  Patient presents with  . Low Hemoglobin    HPI Katrina Meadows is a 85 y.o. female.  HPI  Patient is an 85 year old female with a history of hypothyroidism, hyperlipidemia, hypertension presenting for abnormal lab at her facility, [place].  Patient reports that she has overall been asymptomatic without any chest pain, shortness of breath, dizziness, lightheadedness, syncope or presyncope.  She reports that she has had to have blood transfusions in the past but she does not know why.  She reports that it is been many years since she has had any endoscopy or colonoscopy, and cannot identify when it was or who performed the procedure.  She denies any melena, hematochezia, hematuria.  Denies any headaches, fevers, chills, chest pain, shortness breath, cough, abdominal pain, nausea, vomiting.  She denies any constipation or diarrhea.  Patient thinks that she gets up and walks with a walker daily, and has not been significantly more debilitated recently.  Past Medical History:  Diagnosis Date  . Allergy   . Ankle instability    both   . Cerebral palsy (HCC)   . Hyperlipidemia   . Hypertension   . Mumps   . Pre-diabetes   . Sacral fracture (HCC) 2009  . Scoliosis   . Thyroid disease    hypothyroid  . Tobacco dependence     Patient Active Problem List   Diagnosis Date Noted  . Insomnia 02/13/2018  . Vascular dementia without behavioral disturbance (HCC) 02/13/2018  . Chronic generalized pain 10/29/2017  . Cachexia (HCC) 10/18/2017  . Severe protein-calorie malnutrition (HCC) 10/18/2017  . Unsteady gait 10/18/2017  . Frequent falls 10/18/2017  . Aortic mural thrombus (HCC) 12/20/2013  . Essential hypertension 05/10/2013  . Mixed hyperlipidemia 05/10/2013  . Hypothyroidism 05/10/2013  . Anemia of chronic  disease 05/10/2013    Past Surgical History:  Procedure Laterality Date  . ABDOMINAL AORTIC ANEURYSM REPAIR    . ILIAC ARTERY STENT Left 2009   with fem/fem bypass     OB History   No obstetric history on file.      Home Medications    Prior to Admission medications   Medication Sig Start Date End Date Taking? Authorizing Provider  acetaminophen (TYLENOL) 500 MG tablet Give 1 tablet by mouth before meals for pain   Yes [provider]  amLODipine (NORVASC) 10 MG tablet Take 10 mg by mouth daily.   Yes [provider]  divalproex (DEPAKOTE) 125 MG DR tablet Take 125 mg by mouth 3 (three) times daily.   Yes [provider]  docusate sodium (COLACE) 100 MG capsule Take 100 mg by mouth daily.   Yes [provider]  Ferrous Sulfate (IRON) 325 (65 Fe) MG TABS Take 1 tablet by mouth daily.   Yes [provider]  guaiFENesin (ROBITUSSIN MUCUS+CHEST CONGEST) 100 MG/5ML liquid Take 200 mg by mouth every 6 (six) hours as needed for cough.   Yes [provider]  levothyroxine (SYNTHROID, LEVOTHROID) 50 MCG tablet TAKE 1 TABLET ONCE DAILY BEFORE BREAKFAST. 01/11/15  Yes Lucky Cowboy, MD  Nutritional Supplements (NUTRITIONAL SUPPLEMENT PO) Frozen Nutritional Treat - Give 4 oz by mouth two times daily for weight loss 10/18/17  Yes [provider]  traZODone (DESYREL) 50 MG tablet Take 25 mg by mouth at bedtime.  10/18/17  Yes [provider]  ALPRAZolam (XANAX) 0.25 MG tablet Take 1 tablet (0.25 mg total) by mouth 2 (two) times daily as needed for anxiety. Patient not taking: Reported on 06/20/2018 03/09/18   Sharee Holster, NP    Family History Family History  Problem Relation Age of Onset  . Alzheimer's disease Mother   . Stroke Father   . Heart attack Father   . Heart disease Father   . Alzheimer's disease Father   . Depression Daughter   . Breast cancer Maternal Aunt   . Cancer Maternal Aunt   . Colon cancer  Maternal Aunt   . Heart disease Paternal Grandfather     Social History Social History   Tobacco Use  . Smoking status: Current Every Day Smoker    Packs/day: 1.50    Years: 66.00    Pack years: 99.00    Types: Cigarettes  . Smokeless tobacco: Never Used  Substance Use Topics  . Alcohol use: No  . Drug use: No     Allergies   Ace inhibitors; Chocolate; Clindamycin/lincomycin; Codeine; Copper-containing compounds; Lotrel [amlodipine besy-benazepril hcl]; Nickel; and Sulfa antibiotics   Review of Systems Review of Systems  Constitutional: Negative for chills and fever.  HENT: Negative for congestion and sore throat.   Eyes: Negative for visual disturbance.  Respiratory: Negative for cough, chest tightness and shortness of breath.   Cardiovascular: Negative for chest pain, palpitations and leg swelling.  Gastrointestinal: Negative for abdominal pain, constipation, diarrhea, nausea and vomiting.  Genitourinary: Negative for dysuria and flank pain.  Musculoskeletal: Negative for back pain and myalgias.  Skin: Negative for rash.  Neurological: Positive for weakness. Negative for dizziness, syncope, light-headedness and headaches.       +Generalized weakness     Physical Exam Updated Vital Signs BP 139/69 (BP Location: Right Arm)   Pulse 87   Temp 97.6 F (36.4 C) (Oral)   Resp 18   SpO2 98% Comment: RA  Physical Exam Vitals signs and nursing note reviewed.  Constitutional:      General: She is not in acute distress.    Appearance: She is well-developed. She is not ill-appearing.     Comments: Patient chronically ill and poorly nourished appearing. Appearing older than stated age.   HENT:     Head: Normocephalic and atraumatic.     Mouth/Throat:     Mouth: Mucous membranes are moist.  Eyes:     Pupils: Pupils are equal, round, and reactive to light.     Comments: Mucous membranes pale.  Neck:     Musculoskeletal: Normal range of motion and neck supple.    Cardiovascular:     Rate and Rhythm: Normal rate and regular rhythm.     Pulses:          Radial pulses are 2+ on the right side and 2+ on the left side.       Popliteal pulses are 2+ on the right side and 2+ on the left side.     Heart sounds: S1 normal and S2 normal. No murmur.  Pulmonary:     Effort: Pulmonary effort is normal.     Breath sounds: Normal breath sounds. No wheezing or rales.  Abdominal:     General: There is no distension.     Palpations: Abdomen is soft.     Tenderness: There is no abdominal tenderness. There is no guarding.  Genitourinary:    Comments: Rectal examination performed with nurse tech chaperone present.  No external hemorrhoids  or prolapsed internal hemorrhoids.  Normal rectal tone.  Soft brown stool present in rectal vault without any melena or bleeding. Musculoskeletal: Normal range of motion.        General: No swelling or deformity.  Lymphadenopathy:     Cervical: No cervical adenopathy.  Skin:    General: Skin is warm and dry.     Findings: No erythema or rash.  Neurological:     Comments: Cranial nerves grossly intact. Patient moves extremities symmetrically and with good coordination.  Psychiatric:        Behavior: Behavior normal.        Thought Content: Thought content normal.        Judgment: Judgment normal.      ED Treatments / Results  Labs (all labs ordered are listed, but only abnormal results are displayed) Labs Reviewed  CBC WITH DIFFERENTIAL/PLATELET - Abnormal; Notable for the following components:      Result Value   RBC 3.55 (*)    Hemoglobin 6.6 (*)    HCT 25.9 (*)    MCV 73.0 (*)    MCH 18.6 (*)    MCHC 25.5 (*)    RDW 17.2 (*)    All other components within normal limits  COMPREHENSIVE METABOLIC PANEL - Abnormal; Notable for the following components:   Glucose, Bld 137 (*)    AST 13 (*)    All other components within normal limits  RETICULOCYTES - Abnormal; Notable for the following components:   RBC. 3.40  (*)    Immature Retic Fract 21.2 (*)    All other components within normal limits  URINE CULTURE  TROPONIN I  VITAMIN B12  FOLATE  IRON AND TIBC  FERRITIN  URINALYSIS, ROUTINE W REFLEX MICROSCOPIC  POC OCCULT BLOOD, ED  TYPE AND SCREEN  PREPARE RBC (CROSSMATCH)    EKG EKG Interpretation  Date/Time:  Monday June 20 2018 19:47:59 EST Ventricular Rate:  76 PR Interval:    QRS Duration: 98 QT Interval:  381 QTC Calculation: 429 R Axis:   52 Text Interpretation:  Sinus rhythm Low voltage, extremity and precordial leads Posterior infarct, old Confirmed by Geoffery LyonseLo, Douglas ([id number]) on 06/20/2018 10:27:32 PM   Radiology Dg Chest 2 View  Result Date: 06/20/2018 CLINICAL DATA:  Low hemoglobin.  No patient complaints. EXAM: CHEST - 2 VIEW COMPARISON:  Multiple chest x-rays and CT scans since July 2015. FINDINGS: There is prominence of the thoracic aorta which is been similar on imaging since 2015. No pneumothorax. No nodule, mass, or focal infiltrate. No other acute abnormalities. Anterior wedging of a midthoracic vertebral body is stable since 2015. IMPRESSION: No acute abnormalities to explain the patient's symptoms. Electronically Signed   By: Gerome Samavid  Williams III M.D   On: 06/20/2018 20:29    Procedures Procedures (including critical care time) CRITICAL CARE Performed by: Elisha PonderAlyssa B    Total critical care time: 35 minutes  Critical care time was exclusive of separately billable procedures and treating other patients.  Critical care was necessary to treat or prevent imminent or life-threatening deterioration.  Critical care was time spent personally by me on the following activities: development of treatment plan with patient and/or surrogate as well as nursing, discussions with consultants, evaluation of patient's response to treatment, examination of patient, obtaining history from patient or surrogate, ordering and performing treatments and interventions, ordering and review  of laboratory studies, ordering and review of radiographic studies, pulse oximetry and re-evaluation of patient's condition.   Medications Ordered in ED  Medications  0.9 %  sodium chloride infusion (has no administration in time range)     Initial Impression / Assessment and Plan / ED Course  I have reviewed the triage vital signs and the nursing notes.  Pertinent labs & imaging results that were available during my care of the patient were reviewed by me and considered in my medical decision making (see chart for details).  Clinical Course as of Jun 20 2109  Mon Jun 20, 2018  2046 Pt to be transfused.   Hemoglobin(!!): 6.6 [AM]  2110 Spoke with Dr. Toniann FailKakrakandy of [facility] who will admit patient. Appreciate his involvement in the care of this patient.    [AM]    Clinical Course User Index [AM] Elisha PonderMurray,  B, PA-C    Patient overall nontoxic-appearing and hemodynamically stable.  Patient presenting for abnormal hemoglobin of 6.7 at her facility[facility].  Patient's hemoglobin is 6.6 today.  Patient does not appear to be acutely bleeding, as rectal exam demonstrates no melena or hematochezia.  This may be occult rectal bleeding, this patient has MCV of 73, ferritin of 6, although TIBC normal. Renal function is normal. See below for hemoglobin trend. Baseline is slightly above 9.  Hemoglobin  Date Value Ref Range Status  06/20/2018 6.6 (LL) 12.0 - 15.0 g/dL Final    Comment:    REPEATED TO VERIFY Reticulocyte Hemoglobin testing may be clinically indicated, consider ordering this additional test [id number] THIS CRITICAL RESULT HAS VERIFIED AND BEEN CALLED TO A.HODGES BY NATHAN THOMPSON ON 01 13 2020 AT 2007, AND HAS BEEN READ BACK. CRITICAL RESULT VERIFIED   03/18/2018 9.1 (A) 12.0 - 16.0 Final  02/04/2018 9.2 (A) 12.0 - 16.0 Final  02/04/2018 9.2 (A) 12.0 - 16.0 Final    Will admit for further work-up and management.  1 unit is ordered over 2 hours due to age.   Last echo in 2015 showed normal EF with Grade 1 diastolic dysfunction. Patient reports she is on her own medical decision maker, appears of normal capacity at this time, and consented to blood transfusion.  This is a shared visit with Dr. Emily Filbertoug Delo. Patient was independently evaluated by this attending physician. Attending physician consulted in evaluation and admission management.  Final Clinical Impressions(s) / ED Diagnoses   Final diagnoses:  Low hemoglobin    ED Discharge Orders    None       Delia ChimesMurray,  B, PA-C 06/21/18 0202    Geoffery Lyonselo, Douglas, MD 06/21/18 [phone]

## 2018-06-20 NOTE — ED Triage Notes (Signed)
Patient BIB [facility] for low hemoglobin of 6.8 (per facility and EMS). Pt a/o x4. Denies any complaints.

## 2018-06-20 NOTE — ED Notes (Signed)
Bed: WA01 Expected date:  Expected time:  Means of arrival:  Comments: EMS 

## 2018-06-20 NOTE — ED Notes (Signed)
ED TO INPATIENT HANDOFF REPORT  Name/Age/Gender Katrina Meadows 85 y.o. female  Code Status    Code Status Orders  (From admission, onward)         Start     Ordered   06/20/18 2211  Do not attempt resuscitation (DNR)  Continuous    Question Answer Comment  In the event of cardiac or respiratory ARREST Do not call a "code blue"   In the event of cardiac or respiratory ARREST Do not perform Intubation, CPR, defibrillation or ACLS   In the event of cardiac or respiratory ARREST Use medication by any route, position, wound care, and other measures to relive pain and suffering. May use oxygen, suction and manual treatment of airway obstruction as needed for comfort.      06/20/18 2211        Code Status History    Date Active Date Inactive Code Status Order ID Comments User Context   12/20/2013 1701 12/23/2013 1759 DNR [id number]  Corrie Dandy Inpatient   12/20/2013 1556 12/20/2013 1701 DNR [id number]  [facility] Dandy ED      Home/SNF/Other Nursing Home  Chief Complaint Low Hemoglobin  Level of Care/Admitting Diagnosis ED Disposition    ED Disposition Condition Comment   Admit  Hospital Area: [facility] [[id number]]  Level of Care: Med-Surg [16]  Diagnosis: Symptomatic anemia [1194174]  Admitting Physician: Eduard Clos [phone]  Attending Physician: Eduard Clos [[id number]]  PT Class (Do Not Modify): Observation [104]  PT Acc Code (Do Not Modify): Observation [10022]       Medical History Past Medical History:  Diagnosis Date  . Allergy   . Ankle instability    both   . Cerebral palsy (HCC)   . Hyperlipidemia   . Hypertension   . Mumps   . Pre-diabetes   . Sacral fracture (HCC) 2009  . Scoliosis   . Thyroid disease    hypothyroid  . Tobacco dependence     Allergies Allergies  Allergen Reactions  . Ace Inhibitors Other (See Comments)    unknown  . Chocolate   . Clindamycin/Lincomycin Other (See Comments)   unsure  . Codeine Other (See Comments)    Could not walk, barely coming to  . Copper-Containing Compounds   . Lotrel [Amlodipine Besy-Benazepril Hcl] Other (See Comments)    unknown  . Nickel Other (See Comments)    unknown  . Sulfa Antibiotics Swelling    IV Location/Drains/Wounds Patient Lines/Drains/Airways Status   Active Line/Drains/Airways    Name:   Placement date:   Placement time:   Site:   Days:   Peripheral IV 06/20/18 Left Forearm   06/20/18    1911    Forearm   less than 1          Labs/Imaging Results for orders placed or performed during the hospital encounter of 06/20/18 (from the past 48 hour(s))  ABO/Rh     Status: None (Preliminary result)   Collection Time: 06/20/18  7:05 PM  Result Value Ref Range   ABO/RH(D)      A POS Performed at [facility], [address] [place], [place] [address]   CBC with Differential     Status: Abnormal   Collection Time: 06/20/18  7:25 PM  Result Value Ref Range   WBC 5.7 4.0 - 10.5 K/uL   RBC 3.55 (L) 3.87 - 5.11 MIL/uL   Hemoglobin 6.6 (LL) 12.0 - 15.0 g/dL  Comment: REPEATED TO VERIFY Reticulocyte Hemoglobin testing may be clinically indicated, consider ordering this additional test [id number] THIS CRITICAL RESULT HAS VERIFIED AND BEEN CALLED TO A.HODGES BY NATHAN THOMPSON ON 01 13 2020 AT 2007, AND HAS BEEN READ BACK. CRITICAL RESULT VERIFIED    HCT 25.9 (L) 36.0 - 46.0 %   MCV 73.0 (L) 80.0 - 100.0 fL   MCH 18.6 (L) 26.0 - 34.0 pg   MCHC 25.5 (L) 30.0 - 36.0 g/dL   RDW 54.017.2 (H) 98.111.5 - 19.115.5 %   Platelets 275 150 - 400 K/uL   nRBC 0.0 0.0 - 0.2 %   Neutrophils Relative % 69 %   Neutro Abs 3.9 1.7 - 7.7 K/uL   Lymphocytes Relative 18 %   Lymphs Abs 1.0 0.7 - 4.0 K/uL   Monocytes Relative 10 %   Monocytes Absolute 0.6 0.1 - 1.0 K/uL   Eosinophils Relative 3 %   Eosinophils Absolute 0.2 0.0 - 0.5 K/uL   Basophils Relative 0 %   Basophils Absolute 0.0 0.0 - 0.1 K/uL   Immature  Granulocytes 0 %   Abs Immature Granulocytes 0.01 0.00 - 0.07 K/uL    Comment: Performed at [facility], [address] [place], [place] [address]  Comprehensive metabolic panel     Status: Abnormal   Collection Time: 06/20/18  7:25 PM  Result Value Ref Range   Sodium 141 135 - 145 mmol/L   Potassium 3.6 3.5 - 5.1 mmol/L   Chloride 104 98 - 111 mmol/L   CO2 27 22 - 32 mmol/L   Glucose, Bld 137 (H) 70 - 99 mg/dL   BUN 20 8 - 23 mg/dL   Creatinine, Ser 5.620.73 0.44 - 1.00 mg/dL   Calcium 8.9 8.9 - 13.010.3 mg/dL   Total Protein 6.5 6.5 - 8.1 g/dL   Albumin 3.6 3.5 - 5.0 g/dL   AST 13 (L) 15 - 41 U/L   ALT 10 0 - 44 U/L   Alkaline Phosphatase 50 38 - 126 U/L   Total Bilirubin 0.7 0.3 - 1.2 mg/dL   GFR calc non Af Amer >60 >60 mL/min   GFR calc Af Amer >60 >60 mL/min   Anion gap 10 5 - 15    Comment: Performed at [facility], [address] [place], [place] [address]  Troponin I - ONCE - STAT     Status: None   Collection Time: 06/20/18  7:25 PM  Result Value Ref Range   Troponin I <0.03 <0.03 ng/mL    Comment: Performed at [facility], [address] [place], [place] [address]  Type and screen [facility]     Status: None (Preliminary result)   Collection Time: 06/20/18  7:30 PM  Result Value Ref Range   ABO/RH(D) A POS    Antibody Screen NEG    Sample Expiration 06/23/2018    Unit Number [id number]    Blood Component Type RED CELLS,LR    Unit division 00    Status of Unit ISSUED    Transfusion Status OK TO TRANSFUSE    Crossmatch Result      Compatible Performed at [facility], [address] [place], [place] [address]   Vitamin B12     Status: None   Collection Time: 06/20/18  7:43 PM  Result Value Ref Range   Vitamin B-12 486 180 - 914 pg/mL    Comment: (NOTE) This assay is not validated for  testing neonatal or myeloproliferative syndrome specimens for Vitamin B12  levels. Performed at [facility], [address] [place], [place] [address]   Folate     Status: None   Collection Time: 06/20/18  7:43 PM  Result Value Ref Range   Folate 11.4 >5.9 ng/mL    Comment: Performed at [facility], [address] [place], [place] [address]  Iron and TIBC     Status: Abnormal   Collection Time: 06/20/18  7:43 PM  Result Value Ref Range   Iron 11 (L) 28 - 170 ug/dL   TIBC 324412 401250 - 027450 ug/dL   Saturation Ratios 3 (L) 10.4 - 31.8 %   UIBC 401 ug/dL    Comment: Performed at [facility], [address] [place], [place] [address]  Ferritin     Status: Abnormal   Collection Time: 06/20/18  7:43 PM  Result Value Ref Range   Ferritin 6 (L) 11 - 307 ng/mL    Comment: Performed at [facility], [address] [place], [place] [address]  Reticulocytes     Status: Abnormal   Collection Time: 06/20/18  7:43 PM  Result Value Ref Range   Retic Ct Pct 1.6 0.4 - 3.1 %   RBC. 3.40 (L) 3.87 - 5.11 MIL/uL   Retic Count, Absolute 55.4 19.0 - 186.0 K/uL   Immature Retic Fract 21.2 (H) 2.3 - 15.9 %    Comment: Performed at [facility], [address] [place], [place] [address]  Urinalysis, Routine w reflex microscopic     Status: None   Collection Time: 06/20/18  7:43 PM  Result Value Ref Range   Color, Urine YELLOW YELLOW   APPearance CLEAR CLEAR   Specific Gravity, Urine 1.018 1.005 - 1.030   pH 5.0 5.0 - 8.0   Glucose, UA NEGATIVE NEGATIVE mg/dL   Hgb urine dipstick NEGATIVE NEGATIVE   Bilirubin Urine NEGATIVE NEGATIVE   Ketones, ur NEGATIVE NEGATIVE mg/dL   Protein, ur NEGATIVE NEGATIVE mg/dL   Nitrite NEGATIVE NEGATIVE   Leukocytes, UA NEGATIVE NEGATIVE    Comment: Performed at [facility], [address] [place], [place] [address]  Prepare RBC     Status: None   Collection Time: 06/20/18  8:47 PM  Result Value Ref Range   Order  Confirmation      ORDER PROCESSED BY BLOOD BANK Performed at [facility], [address] [place], [place] [address]   POC occult blood, ED     Status: None   Collection Time: 06/20/18  9:37 PM  Result Value Ref Range   Fecal Occult Bld NEGATIVE NEGATIVE   Dg Chest 2 View  Result Date: 06/20/2018 CLINICAL DATA:  Low hemoglobin.  No patient complaints. EXAM: CHEST - 2 VIEW COMPARISON:  Multiple chest x-rays and CT scans since July 2015. FINDINGS: There is prominence of the thoracic aorta which is been similar on imaging since 2015. No pneumothorax. No nodule, mass, or focal infiltrate. No other acute abnormalities. Anterior wedging of a midthoracic vertebral body is stable since 2015. IMPRESSION: No acute abnormalities to explain the patient's symptoms. Electronically Signed   By: Gerome Samavid  Williams III M.D   On: 06/20/2018 20:29   EKG Interpretation  Date/Time:  Monday June 20 2018 19:47:59 EST Ventricular Rate:  76 PR Interval:    QRS Duration: 98 QT Interval:  381 QTC Calculation: 429 R Axis:   52 Text Interpretation:  Sinus rhythm Low voltage, extremity and precordial leads Posterior infarct, old Confirmed by Geoffery Lyons ([id number]) on 06/20/2018 10:27:32 PM   Pending Labs Unresulted Labs (From admission, onward)    Start     Ordered   06/27/18 0500  Creatinine, serum  (enoxaparin (LOVENOX)    CrCl >/= 30 ml/min)  Weekly,   R    Comments:  while on enoxaparin therapy    06/20/18 2211   06/21/18 0500  Basic metabolic panel  Tomorrow morning,   R     06/20/18 2211   06/21/18 0500  CBC  Tomorrow morning,   R     06/20/18 2211   06/20/18 2211  CBC  (enoxaparin (LOVENOX)    CrCl >/= 30 ml/min)  Once,   R    Comments:  Baseline for enoxaparin therapy IF NOT ALREADY DRAWN.  Notify MD if PLT < 100 K.    06/20/18 2211   06/20/18 2211  Creatinine, serum  (enoxaparin (LOVENOX)    CrCl >/= 30 ml/min)  Once,   R    Comments:  Baseline for enoxaparin therapy IF NOT  ALREADY DRAWN.    06/20/18 2211   06/20/18 1943  Urine culture  ONCE - STAT,   STAT     06/20/18 1942          Vitals/Pain Today's Vitals   06/20/18 2142 06/20/18 2208 06/20/18 2218 06/20/18 2229  BP: (!) 154/66  130/68 128/75  Pulse: 84  79 84  Resp: 17  16 17   Temp: 98.7 F (37.1 C)   98.2 F (36.8 C)  TempSrc: Oral   Oral  SpO2: 95%  94% 94%  PainSc:  0-No pain 0-No pain     Isolation Precautions No active isolations  Medications Medications  amLODipine (NORVASC) tablet 10 mg (has no administration in time range)  traZODone (DESYREL) tablet 25 mg (has no administration in time range)  levothyroxine (SYNTHROID, LEVOTHROID) tablet 50 mcg (has no administration in time range)  docusate sodium (COLACE) capsule 100 mg (has no administration in time range)  Iron TABS 325 mg (has no administration in time range)  divalproex (DEPAKOTE) DR tablet 125 mg (has no administration in time range)  acetaminophen (TYLENOL) tablet 650 mg (has no administration in time range)    Or  acetaminophen (TYLENOL) suppository 650 mg (has no administration in time range)  ondansetron (ZOFRAN) tablet 4 mg (has no administration in time range)    Or  ondansetron (ZOFRAN) injection 4 mg (has no administration in time range)  enoxaparin (LOVENOX) injection 40 mg (has no administration in time range)  0.9 %  sodium chloride infusion (10 mL/hr Intravenous New Bag/Given 06/20/18 2121)    Mobility walks with device

## 2018-06-20 NOTE — ED Notes (Signed)
Date and time results received: 06/20/18 2013 (use smartphrase ".now" to insert current time)  Test: Hemoglobin Critical Value: 6.6  Name of Provider Notified:Alyssa, PA  Orders Received? Or Actions Taken?:none

## 2018-06-21 DIAGNOSIS — D649 Anemia, unspecified: Secondary | ICD-10-CM | POA: Diagnosis not present

## 2018-06-21 LAB — BPAM RBC
Blood Product Expiration Date: 202002042359
ISSUE DATE / TIME: 202001132151
Unit Type and Rh: 6200

## 2018-06-21 LAB — BASIC METABOLIC PANEL
Anion gap: 7 (ref 5–15)
BUN: 11 mg/dL (ref 8–23)
CO2: 29 mmol/L (ref 22–32)
Calcium: 8.5 mg/dL — ABNORMAL LOW (ref 8.9–10.3)
Chloride: 106 mmol/L (ref 98–111)
Creatinine, Ser: 0.5 mg/dL (ref 0.44–1.00)
GFR calc Af Amer: >60 mL/min (ref >60)
GFR calc non Af Amer: >60 mL/min (ref >60)
Glucose, Bld: 90 mg/dL (ref 70–99)
Potassium: 3.8 mmol/L (ref 3.5–5.1)
SODIUM: 142 mmol/L (ref 135–145)

## 2018-06-21 LAB — CBC
HCT: 29.9 % — ABNORMAL LOW (ref 36.0–46.0)
Hemoglobin: 8.3 g/dL — ABNORMAL LOW (ref 12.0–15.0)
MCH: 21.1 pg — ABNORMAL LOW (ref 26.0–34.0)
MCHC: 27.8 g/dL — ABNORMAL LOW (ref 30.0–36.0)
MCV: 75.9 fL — ABNORMAL LOW (ref 80.0–100.0)
PLATELETS: 244 10*3/uL (ref 150–400)
RBC: 3.94 MIL/uL (ref 3.87–5.11)
RDW: 17.4 % — ABNORMAL HIGH (ref 11.5–15.5)
WBC: 5.2 10*3/uL (ref 4.0–10.5)
nRBC: 0 % (ref 0.0–0.2)

## 2018-06-21 LAB — TYPE AND SCREEN
ABO/RH(D): A POS
Antibody Screen: NEGATIVE
Unit division: 0

## 2018-06-21 LAB — VALPROIC ACID LEVEL: Valproic Acid Lvl: 28 ug/mL — ABNORMAL LOW (ref 50.0–100.0)

## 2018-06-21 LAB — ABO/RH: ABO/RH(D): A POS

## 2018-06-21 LAB — MRSA PCR SCREENING: MRSA by PCR: NEGATIVE

## 2018-06-21 MED ORDER — ENOXAPARIN SODIUM 30 MG/0.3ML ~~LOC~~ SOLN
30.0000 mg | Freq: Every day | SUBCUTANEOUS | Status: DC
  Administered 2018-06-21: 30 mg via SUBCUTANEOUS
  Filled 2018-06-21: qty 0.3

## 2018-06-21 NOTE — Progress Notes (Signed)
PROGRESS NOTE  Katrina Meadows ZOX:[id number] DOB: Mar 13, 1929 DOA: 06/20/2018 PCP: No primary care provider on file.  HPI/Recap of past 24 hours:  Katrina Meadows is a 85 y.o. female with history of hypertension, hypothyroidism, chronic anemia, depression anxiety was referred to the ER after patient's hemoglobin noticed in a routine lab work to be around 6.  Patient states she does not have any chest pain shortness of breath dizziness loss of consciousness or noticed any blood in the stools or did not have any vomiting or any blood or any hematuria.  ED Course: In the ER patient's hemoglobin was confirmed to be around 6.6 and stool for occult blood was negative per the ER physician.  Anemia panel shows ferritin of 6 TIBC of 412 consistent with iron deficiency anemia.  Patient's med reconciliation shows that patient is already on ferrous sulfate despite which patient's hemoglobin is low.  Patient has been ordered 1 unit of PRBC admitted for further observation.  06/21/2018: Patient seen and examined at bedside.  She denies abdominal pain or nausea.  Denies melena or hematochezia.  FOBT negative.  Patient has dementia unable to provide a history.  Unclear if she is ever had a colonoscopy.  Iron deficient on iron supplement at home.  Patient admits not taking her medications regularly.    Assessment/Plan: Principal Problem:   Symptomatic anemia Active Problems:   Essential hypertension   Hypothyroidism   Severe protein-calorie malnutrition (HCC)   Vascular dementia without behavioral disturbance (HCC)  1. Severe iron deficiency anemia -1 unit of PRBC has been ordered repeat CBC.    Patient noncompliant with her medications.  Admission was not taking her medications regularly.  Possibly she was not taking ferrous sulfate as prescribed.  Contact family to find out about colonoscopy lately have any preference for GI group to follow-up outpatient.  Repeat CBC in the morning. 2. Hypertension on  amlodipine. 3. Hypothyroidism on Synthroid. 4. History of depression anxiety on Xanax Depakote and trazodone. 5. Physical debility/ambulatory dysfunction: Obtain PT assessment.  Fall precautions.   DVT prophylaxis:  Subcu daily Lovenox. Code Status: DNR. Family Communication: No family at the bedside. Disposition Plan: Back to skilled nursing facility.  Post likely tomorrow 06/22/2018 after repeat CBC in the morning. Consults called: None. Admission status: Observation.    Objective: Vitals:   06/20/18 2329 06/21/18 0049 06/21/18 0458 06/21/18 1419  BP: (!) 145/69 (!) 145/70 (!) 144/68 (!) 159/74  Pulse: 81 80 72 80  Resp: 16 18 16 18   Temp: 98.1 F (36.7 C) 98.1 F (36.7 C) 98.3 F (36.8 C) 98 F (36.7 C)  TempSrc: Oral Oral Oral Oral  SpO2: 97% 98% 99% 98%  Weight: 39.4 kg     Height: 5' (1.524 m)       Intake/Output Summary (Last 24 hours) at 06/21/2018 1604 Last data filed at 06/21/2018 1102 Gross per 24 hour  Intake 912 ml  Output 50 ml  Net 862 ml   Filed Weights   06/20/18 2329  Weight: 39.4 kg    Exam:  . General: 85 y.o. year-old female frail in no acute distress.  Alert and interactive. . Cardiovascular: Regular rate and rhythm with no rubs or gallops.  No thyromegaly or JVD noted.   Marland Kitchen. Respiratory: Clear to auscultation with no wheezes or rales. Good inspiratory effort. . Abdomen: Soft nontender nondistended with normal bowel sounds x4 quadrants. . Musculoskeletal: Trace lower extremity edema. 2/4 pulses in all 4 extremities. Marland Kitchen. Psychiatry: Mood is  appropriate for condition and setting   Data Reviewed: CBC: Recent Labs  Lab 06/20/18 1925 06/21/18 0914  WBC 5.7 5.2  NEUTROABS 3.9  --   HGB 6.6* 8.3*  HCT 25.9* 29.9*  MCV 73.0* 75.9*  PLT 275 244   Basic Metabolic Panel: Recent Labs  Lab 06/20/18 1925 06/21/18 0914  NA 141 142  K 3.6 3.8  CL 104 106  CO2 27 29  GLUCOSE 137* 90  BUN 20 11  CREATININE 0.73 0.50  CALCIUM 8.9 8.5*    GFR: Estimated Creatinine Clearance: 30.8 mL/min (by C-G formula based on SCr of 0.5 mg/dL). Liver Function Tests: Recent Labs  Lab 06/20/18 1925  AST 13*  ALT 10  ALKPHOS 50  BILITOT 0.7  PROT 6.5  ALBUMIN 3.6   No results for input(s): LIPASE, AMYLASE in the last 168 hours. No results for input(s): AMMONIA in the last 168 hours. Coagulation Profile: No results for input(s): INR, PROTIME in the last 168 hours. Cardiac Enzymes: Recent Labs  Lab 06/20/18 1925  TROPONINI <0.03   BNP (last 3 results) No results for input(s): PROBNP in the last 8760 hours. HbA1C: No results for input(s): HGBA1C in the last 72 hours. CBG: No results for input(s): GLUCAP in the last 168 hours. Lipid Profile: No results for input(s): CHOL, HDL, LDLCALC, TRIG, CHOLHDL, LDLDIRECT in the last 72 hours. Thyroid Function Tests: No results for input(s): TSH, T4TOTAL, FREET4, T3FREE, THYROIDAB in the last 72 hours. Anemia Panel: Recent Labs    06/20/18 1943  VITAMINB12 486  FOLATE 11.4  FERRITIN 6*  TIBC 412  IRON 11*  RETICCTPCT 1.6   Urine analysis:    Component Value Date/Time   COLORURINE YELLOW 06/20/2018 1943   APPEARANCEUR CLEAR 06/20/2018 1943   LABSPEC 1.018 06/20/2018 1943   PHURINE 5.0 06/20/2018 1943   GLUCOSEU NEGATIVE 06/20/2018 1943   HGBUR NEGATIVE 06/20/2018 1943   BILIRUBINUR NEGATIVE 06/20/2018 1943   KETONESUR NEGATIVE 06/20/2018 1943   PROTEINUR NEGATIVE 06/20/2018 1943   UROBILINOGEN 0.2 12/20/2013 1143   NITRITE NEGATIVE 06/20/2018 1943   LEUKOCYTESUR NEGATIVE 06/20/2018 1943   Sepsis Labs: @LABRCNTIP (procalcitonin:4,lacticidven:4)  ) Recent Results (from the past 240 hour(s))  MRSA PCR Screening     Status: None   Collection Time: 06/20/18 11:52 PM  Result Value Ref Range Status   MRSA by PCR NEGATIVE NEGATIVE Final    Comment:        The GeneXpert MRSA Assay (FDA approved for NASAL specimens only), is one component of a comprehensive MRSA  colonization surveillance program. It is not intended to diagnose MRSA infection nor to guide or monitor treatment for MRSA infections. Performed at [facility], [address] [place], [place] [address]       Studies: Dg Chest 2 View  Result Date: 06/20/2018 CLINICAL DATA:  Low hemoglobin.  No patient complaints. EXAM: CHEST - 2 VIEW COMPARISON:  Multiple chest x-rays and CT scans since July 2015. FINDINGS: There is prominence of the thoracic aorta which is been similar on imaging since 2015. No pneumothorax. No nodule, mass, or focal infiltrate. No other acute abnormalities. Anterior wedging of a midthoracic vertebral body is stable since 2015. IMPRESSION: No acute abnormalities to explain the patient's symptoms. Electronically Signed   By: Gerome Samavid  Williams III M.D   On: 06/20/2018 20:29    Scheduled Meds: . amLODipine  10 mg Oral Daily  . divalproex  125 mg Oral TID  . docusate sodium  100 mg Oral  Daily  . enoxaparin (LOVENOX) injection  30 mg Subcutaneous QHS  . ferrous sulfate  325 mg Oral Daily  . levothyroxine  50 mcg Oral Q0600  . traZODone  25 mg Oral QHS    Continuous Infusions:   LOS: 0 days     Darlin Drop, MD [facility] Pager [phone]  If 7PM-7AM, please contact night-coverage [email or web address] Password [facility] 06/21/2018, 4:04 PM

## 2018-06-22 DIAGNOSIS — D649 Anemia, unspecified: Secondary | ICD-10-CM | POA: Diagnosis not present

## 2018-06-22 LAB — URINE CULTURE

## 2018-06-22 LAB — BASIC METABOLIC PANEL
Anion gap: 9 (ref 5–15)
BUN: 10 mg/dL (ref 8–23)
CO2: 27 mmol/L (ref 22–32)
CREATININE: 0.47 mg/dL (ref 0.44–1.00)
Calcium: 8.6 mg/dL — ABNORMAL LOW (ref 8.9–10.3)
Chloride: 106 mmol/L (ref 98–111)
GFR calc Af Amer: >60 mL/min (ref >60)
GFR calc non Af Amer: >60 mL/min (ref >60)
Glucose, Bld: 89 mg/dL (ref 70–99)
Potassium: 3.6 mmol/L (ref 3.5–5.1)
SODIUM: 142 mmol/L (ref 135–145)

## 2018-06-22 LAB — CBC
HCT: 31.3 % — ABNORMAL LOW (ref 36.0–46.0)
Hemoglobin: 8.6 g/dL — ABNORMAL LOW (ref 12.0–15.0)
MCH: 20.9 pg — ABNORMAL LOW (ref 26.0–34.0)
MCHC: 27.5 g/dL — ABNORMAL LOW (ref 30.0–36.0)
MCV: 76 fL — ABNORMAL LOW (ref 80.0–100.0)
Platelets: 236 10*3/uL (ref 150–400)
RBC: 4.12 MIL/uL (ref 3.87–5.11)
RDW: 18.4 % — ABNORMAL HIGH (ref 11.5–15.5)
WBC: 5.7 10*3/uL (ref 4.0–10.5)
nRBC: 0 % (ref 0.0–0.2)

## 2018-06-22 NOTE — Clinical Social Work Note (Signed)
Pt returning to [facility] SNF room 211A- report [phone]  Clinical Social Work Assessment  Patient Details  Name: Katrina Meadows MRN: [id number] Date of Birth: November 17, 1928  Date of referral:  06/22/18               Reason for consult:  Discharge Planning                Permission sought to share information with:  Family Supports Permission granted to share information::     Name::     daughter Bonita Quin  Agency::     Relationship::     Contact Information:     Housing/Transportation Living arrangements for the past 2 months:  Skilled Building surveyor of Information:  Adult Children, Medical Team Patient Interpreter Needed:  None Criminal Activity/Legal Involvement Pertinent to Current Situation/Hospitalization:  No - Comment as needed Significant Relationships:  Adult Children, Merchandiser, retail, Other Family Members Lives with:  Facility Resident Do you feel safe going back to the place where you live?  Yes Need for family participation in patient care:  Yes (Comment)(family participating)  Care giving concerns:  Pt admitted from SNF where she is long term care resident. Admitted for anemia.  Uses walker with assistance at baseline.     Social Worker assessment / plan:  CSW consulted to assist with disposition as pt is resident of [facility].  Plan to return to SNF today. Daughter aware, states she will also inform granddaughter.  Provided DC information to facility and will arrange transportation.  Employment status:  Retired Health and safety inspector:  Armed forces operational officer, Medicaid [place] PT Recommendations:    Information / Referral to community resources:     Patient/Family's Response to care:  Adult nurse- daughter reports pt's granddaughter is [employer] employee at alternate site and that that has been helpful in understanding plan  Patient/Family's Understanding of and Emotional Response to Diagnosis, Current Treatment, and Prognosis:  Daughter asks  details about cause of anemia- CSW directed her to medical staff for detailed description of hosptial course. Was pleasant and appropriate to situation emotionally  Emotional Assessment Appearance:  Appears stated age Attitude/Demeanor/Rapport:  (sleeping) Affect (typically observed):  Calm, Appropriate Orientation:  Oriented to Self Alcohol / Substance use:  Not Applicable Psych involvement (Current and /or in the community):  No (Comment)  Discharge Needs  Concerns to be addressed:  No discharge needs identified Readmission within the last 30 days:  No Current discharge risk:  None Barriers to Discharge:  No Barriers Identified   Nelwyn Salisbury, LCSW 06/22/2018, 2:34 PM [phone]

## 2018-06-22 NOTE — Discharge Summary (Signed)
Physician Discharge Summary  Katrina Meadows [id number] DOB: Jun 30, 1928 DOA: 06/20/2018  PCP: No primary care provider on file.  Admit date: 06/20/2018 Discharge date: 06/22/2018  Admitted From: Skilled nursing facility Disposition: Skilled nursing facility  Recommendations for Outpatient Follow-up:  1. Follow up with PCP in 1-2 weeks 2. Please obtain BMP/CBC in one week   Home Health: No Equipment/Devices: None  Discharge Condition: Stable CODE STATUS: Not resuscitate Diet recommendation: Regular diet  Brief/Interim Summary:  #) Acute on chronic anemia: Patient was sent to the emergency room after she was noted to have routine blood work with a hemoglobin of 6.  She received 2 units packed red blood cells with appropriate increase in hemoglobin.  Probably iron deficient.  She is noted to be on iron at her nursing home.  This was restarted.  FOBT testing was unremarkable.  Patient does have outpatient follow-up there is unclear if she would benefit from this.  #) Hypertension: Patient was continued on home amlodipine.  #) Pain/psych: Patient was continued on home trazodone, alprazolam, divalproex.  #) Hypothyroidism: Patient was continued on home levothyroxine.  Discharge Diagnoses:  Principal Problem:   Symptomatic anemia Active Problems:   Essential hypertension   Hypothyroidism   Severe protein-calorie malnutrition (HCC)   Vascular dementia without behavioral disturbance [facility](HCC)    Discharge Instructions  Discharge Instructions    Call MD for:  difficulty breathing, headache or visual disturbances   Complete by:  As directed    Call MD for:  hives   Complete by:  As directed    Call MD for:  persistant dizziness or light-headedness   Complete by:  As directed    Call MD for:  persistant nausea and vomiting   Complete by:  As directed    Call MD for:  redness, tenderness, or signs of infection (pain, swelling, redness, odor or green/yellow discharge around  incision site)   Complete by:  As directed    Call MD for:  severe uncontrolled pain   Complete by:  As directed    Call MD for:  temperature >100.4   Complete by:  As directed    Diet - low sodium heart healthy   Complete by:  As directed    Discharge instructions   Complete by:  As directed    Please follow-up with your primary care doctor in 1 week.   Increase activity slowly   Complete by:  As directed      Allergies as of 06/22/2018      Reactions   Ace Inhibitors Other (See Comments)   unknown   Chocolate    Clindamycin/lincomycin Other (See Comments)   unsure   Codeine Other (See Comments)   Could not walk, barely coming to   Copper-containing Compounds    Lotrel [amlodipine Besy-benazepril Hcl] Other (See Comments)   unknown   Nickel Other (See Comments)   unknown   Sulfa Antibiotics Swelling      Medication List    TAKE these medications   acetaminophen 500 MG tablet Commonly known as:  TYLENOL Give 1 tablet by mouth before meals for pain   ALPRAZolam 0.25 MG tablet Commonly known as:  XANAX Take 1 tablet (0.25 mg total) by mouth 2 (two) times daily as needed for anxiety.   amLODipine 10 MG tablet Commonly known as:  NORVASC Take 10 mg by mouth daily.   divalproex 125 MG DR tablet Commonly known as:  DEPAKOTE Take 125 mg by mouth 3 (three) times daily.  docusate sodium 100 MG capsule Commonly known as:  COLACE Take 100 mg by mouth daily.   Iron 325 (65 Fe) MG Tabs Take 1 tablet by mouth daily.   levothyroxine 50 MCG tablet Commonly known as:  SYNTHROID, LEVOTHROID TAKE 1 TABLET ONCE DAILY BEFORE BREAKFAST.   NUTRITIONAL SUPPLEMENT PO Frozen Nutritional Treat - Give 4 oz by mouth two times daily for weight loss   ROBITUSSIN MUCUS+CHEST CONGEST 100 MG/5ML liquid Generic drug:  guaiFENesin Take 200 mg by mouth every 6 (six) hours as needed for cough.   traZODone 50 MG tablet Commonly known as:  DESYREL Take 25 mg by mouth at bedtime.        Allergies  Allergen Reactions  . Ace Inhibitors Other (See Comments)    unknown  . Chocolate   . Clindamycin/Lincomycin Other (See Comments)    unsure  . Codeine Other (See Comments)    Could not walk, barely coming to  . Copper-Containing Compounds   . Lotrel [Amlodipine Besy-Benazepril Hcl] Other (See Comments)    unknown  . Nickel Other (See Comments)    unknown  . Sulfa Antibiotics Swelling    Consultations:  None   Procedures/Studies: Dg Chest 2 View  Result Date: 06/20/2018 CLINICAL DATA:  Low hemoglobin.  No patient complaints. EXAM: CHEST - 2 VIEW COMPARISON:  Multiple chest x-rays and CT scans since July 2015. FINDINGS: There is prominence of the thoracic aorta which is been similar on imaging since 2015. No pneumothorax. No nodule, mass, or focal infiltrate. No other acute abnormalities. Anterior wedging of a midthoracic vertebral body is stable since 2015. IMPRESSION: No acute abnormalities to explain the patient's symptoms. Electronically Signed   By: Gerome Samavid  Williams III M.D   On: 06/20/2018 20:29      Subjective:   Discharge Exam: Vitals:   06/21/18 2121 06/22/18 0627  BP: (!) 143/76 139/64  Pulse: 80 83  Resp: 16 14  Temp: 98.4 F (36.9 C)   SpO2: 93% 93%   Vitals:   06/21/18 0458 06/21/18 1419 06/21/18 2121 06/22/18 0627  BP: (!) 144/68 (!) 159/74 (!) 143/76 139/64  Pulse: 72 80 80 83  Resp: 16 18 16 14   Temp: 98.3 F (36.8 C) 98 F (36.7 C) 98.4 F (36.9 C)   TempSrc: Oral Oral Oral   SpO2: 99% 98% 93% 93%  Weight:      Height:        General: Pt is alert, awake, not in acute distress Cardiovascular: RRR, S1/S2 +, no rubs, no gallops Respiratory: CTA bilaterally, no wheezing, no rhonchi Abdominal: Soft, NT, ND, bowel sounds + Extremities: no edema    The results of significant diagnostics from this hospitalization (including imaging, microbiology, ancillary and laboratory) are listed below for reference.      Microbiology: Recent Results (from the past 240 hour(s))  Urine culture     Status: Abnormal   Collection Time: 06/20/18  7:43 PM  Result Value Ref Range Status   Specimen Description   Final    URINE, CLEAN CATCH Performed at [facility], [address] [place], [place] [address]    Special Requests   Final    NONE Performed at [facility], [address] [place], [place] [address]    Culture (A)  Final    >=100,000 COLONIES/mL LACTOBACILLUS SPECIES Standardized susceptibility testing for this organism is not available. Performed at [facility], [address] [place], [place] [address]  Report Status 06/22/2018 FINAL  Final  MRSA PCR Screening     Status: None   Collection Time: 06/20/18 11:52 PM  Result Value Ref Range Status   MRSA by PCR NEGATIVE NEGATIVE Final    Comment:        The GeneXpert MRSA Assay (FDA approved for NASAL specimens only), is one component of a comprehensive MRSA colonization surveillance program. It is not intended to diagnose MRSA infection nor to guide or monitor treatment for MRSA infections. Performed at [facility], [address], [place], [place] [address]      Labs: BNP (last 3 results) No results for input(s): BNP in the last 8760 hours. Basic Metabolic Panel: Recent Labs  Lab 06/20/18 1925 06/21/18 0914 06/22/18 0343  NA 141 142 142  K 3.6 3.8 3.6  CL 104 106 106  CO2 27 29 27   GLUCOSE 137* 90 89  BUN 20 11 10   CREATININE 0.73 0.50 0.47  CALCIUM 8.9 8.5* 8.6*   Liver Function Tests: Recent Labs  Lab 06/20/18 1925  AST 13*  ALT 10  ALKPHOS 50  BILITOT 0.7  PROT 6.5  ALBUMIN 3.6   No results for input(s): LIPASE, AMYLASE in the last 168 hours. No results for input(s): AMMONIA in the last 168 hours. CBC: Recent Labs  Lab 06/20/18 1925 06/21/18 0914 06/22/18 0343  WBC 5.7 5.2 5.7  NEUTROABS 3.9  --   --   HGB 6.6* 8.3* 8.6*  HCT  25.9* 29.9* 31.3*  MCV 73.0* 75.9* 76.0*  PLT 275 244 236   Cardiac Enzymes: Recent Labs  Lab 06/20/18 1925  TROPONINI <0.03   BNP: Invalid input(s): POCBNP CBG: No results for input(s): GLUCAP in the last 168 hours. D-Dimer No results for input(s): DDIMER in the last 72 hours. Hgb A1c No results for input(s): HGBA1C in the last 72 hours. Lipid Profile No results for input(s): CHOL, HDL, LDLCALC, TRIG, CHOLHDL, LDLDIRECT in the last 72 hours. Thyroid function studies No results for input(s): TSH, T4TOTAL, T3FREE, THYROIDAB in the last 72 hours.  Invalid input(s): FREET3 Anemia work up Recent Labs    06/20/18 1943  VITAMINB12 486  FOLATE 11.4  FERRITIN 6*  TIBC 412  IRON 11*  RETICCTPCT 1.6   Urinalysis    Component Value Date/Time   COLORURINE YELLOW 06/20/2018 1943   APPEARANCEUR CLEAR 06/20/2018 1943   LABSPEC 1.018 06/20/2018 1943   PHURINE 5.0 06/20/2018 1943   GLUCOSEU NEGATIVE 06/20/2018 1943   HGBUR NEGATIVE 06/20/2018 1943   BILIRUBINUR NEGATIVE 06/20/2018 1943   KETONESUR NEGATIVE 06/20/2018 1943   PROTEINUR NEGATIVE 06/20/2018 1943   UROBILINOGEN 0.2 12/20/2013 1143   NITRITE NEGATIVE 06/20/2018 1943   LEUKOCYTESUR NEGATIVE 06/20/2018 1943   Sepsis Labs Invalid input(s): PROCALCITONIN,  WBC,  LACTICIDVEN Microbiology Recent Results (from the past 240 hour(s))  Urine culture     Status: Abnormal   Collection Time: 06/20/18  7:43 PM  Result Value Ref Range Status   Specimen Description   Final    URINE, CLEAN CATCH Performed at [facility], [address], [place], [place] [address]    Special Requests   Final    NONE Performed at [facility], [address], [place], [place] [address]    Culture (A)  Final    >=100,000 COLONIES/mL LACTOBACILLUS SPECIES Standardized susceptibility testing for this organism is not available. Performed at [facility], [address] [place], [place] [address]  Report Status 06/22/2018 FINAL  Final  MRSA PCR Screening     Status: None   Collection Time: 06/20/18 11:52 PM  Result Value Ref Range Status   MRSA by PCR NEGATIVE NEGATIVE Final    Comment:        The GeneXpert MRSA Assay (FDA approved for NASAL specimens only), is one component of a comprehensive MRSA colonization surveillance program. It is not intended to diagnose MRSA infection nor to guide or monitor treatment for MRSA infections. Performed at [facility], [address], [place], [place] [address]      Time coordinating discharge: 54  SIGNED:   Delaine Lame, MD  [facility] 06/22/2018, 2:04 PM  If 7PM-7AM, please contact night-coverage [email or web address] Password [email or web address]

## 2018-06-22 NOTE — Discharge Instructions (Signed)
Anemia  Anemia is a condition in which you do not have enough red blood cells or hemoglobin. Hemoglobin is a substance in red blood cells that carries oxygen. When you do not have enough red blood cells or hemoglobin (are anemic), your body cannot get enough oxygen and your organs may not work properly. As a result, you may feel very tired or have other problems. What are the causes? Common causes of anemia include:  Excessive bleeding. Anemia can be caused by excessive bleeding inside or outside the body, including bleeding from the intestine or from periods in women.  Poor nutrition.  Long-lasting (chronic) kidney, thyroid, and liver disease.  Bone marrow disorders.  Cancer and treatments for cancer.  HIV (human immunodeficiency virus) and AIDS (acquired immunodeficiency syndrome).  Treatments for HIV and AIDS.  Spleen problems.  Blood disorders.  Infections, medicines, and autoimmune disorders that destroy red blood cells. What are the signs or symptoms? Symptoms of this condition include:  Minor weakness.  Dizziness.  Headache.  Feeling heartbeats that are irregular or faster than normal (palpitations).  Shortness of breath, especially with exercise.  Paleness.  Cold sensitivity.  Indigestion.  Nausea.  Difficulty sleeping.  Difficulty concentrating. Symptoms may occur suddenly or develop slowly. If your anemia is mild, you may not have symptoms. How is this diagnosed? This condition is diagnosed based on:  Blood tests.  Your medical history.  A physical exam.  Bone marrow biopsy. Your health care provider may also check your stool (feces) for blood and may do additional testing to look for the cause of your bleeding. You may also have other tests, including:  Imaging tests, such as a CT scan or MRI.  Endoscopy.  Colonoscopy. How is this treated? Treatment for this condition depends on the cause. If you continue to lose a lot of blood, you may  need to be treated at a hospital. Treatment may include:  Taking supplements of iron, vitamin M08, or folic acid.  Taking a hormone medicine (erythropoietin) that can help to stimulate red blood cell growth.  Having a blood transfusion. This may be needed if you lose a lot of blood.  Making changes to your diet.  Having surgery to remove your spleen. Follow these instructions at home:  Take over-the-counter and prescription medicines only as told by your health care provider.  Take supplements only as told by your health care provider.  Follow any diet instructions that you were given.  Keep all follow-up visits as told by your health care provider. This is important. Contact a health care provider if:  You develop new bleeding anywhere in the body. Get help right away if:  You are very weak.  You are short of breath.  You have pain in your abdomen or chest.  You are dizzy or feel faint.  You have trouble concentrating.  You have bloody or black, tarry stools.  You vomit repeatedly or you vomit up blood. Summary  Anemia is a condition in which you do not have enough red blood cells or enough of a substance in your red blood cells that carries oxygen (hemoglobin).  Symptoms may occur suddenly or develop slowly.  If your anemia is mild, you may not have symptoms.  This condition is diagnosed with blood tests as well as a medical history and physical exam. Other tests may be needed.  Treatment for this condition depends on the cause of the anemia. This information is not intended to replace advice given to you by  your health care provider. Make sure you discuss any questions you have with your health care provider. °Document Released: 07/02/2004 Document Revised: 06/26/2016 Document Reviewed: 06/26/2016 °Elsevier Interactive Patient Education © 2019 [employer] ° °

## 2018-06-22 NOTE — Evaluation (Signed)
Physical Therapy Evaluation Patient Details Name: Katrina Meadows MRN: [id number] DOB: 1929/02/25 Today's Date: 06/22/2018   History of Present Illness  85 yo female admitted with anemia. Hx of CP, chronic anemia, depression, vascular dementia, anxiety  Clinical Impression  On eval, pt required Mod assist for mobility. She was able to walk ~15 feet x 2 (to and from bathroom) with a RW. Gait is ataxic and pt is at high risk for falls. I suspect pt is a long term care SNF resident. Recommend return to SNF.     Follow Up Recommendations SNF    Equipment Recommendations  None recommended by PT    Recommendations for Other Services       Precautions / Restrictions Precautions Precautions: Fall Restrictions Weight Bearing Restrictions: No      Mobility  Bed Mobility Overal bed mobility: Needs Assistance Bed Mobility: Supine to Sit     Supine to sit: Min assist;HOB elevated     General bed mobility comments: Assist to stabilize.   Transfers Overall transfer level: Needs assistance Equipment used: Rolling walker (2 wheeled) Transfers: Sit to/from Stand Sit to Stand: Mod assist         General transfer comment: Assist to rise, stabilize, control descent. VCs safety, hand placement. Increased time. High fall risk.   Ambulation/Gait Ambulation/Gait assistance: Mod assist Gait Distance (Feet): 15 Feet(x2) Assistive device: Rolling walker (2 wheeled) Gait Pattern/deviations: Step-to pattern;Step-through pattern;Narrow base of support;Scissoring;Trunk flexed     General Gait Details: Assist to stabilize pt and maneuver RW. Cues for foot placement and walker distance. High fall risk. Pt is also fearful of falling (she reports she falls a lot)  Careers information officertairs            Wheelchair Mobility    Modified Rankin (Stroke Patients Only)       Balance Overall balance assessment: Needs assistance;History of Falls         Standing balance support: Bilateral upper  extremity supported Standing balance-Leahy Scale: Poor                               Pertinent Vitals/Pain Pain Assessment: No/denies pain    Home Living Family/patient expects to be discharged to:: Skilled nursing facility                      Prior Function                 Hand Dominance        Extremity/Trunk Assessment   Upper Extremity Assessment Upper Extremity Assessment: Generalized weakness    Lower Extremity Assessment Lower Extremity Assessment: Generalized weakness(internally rotated L foot. Strength at least 3/5)    Cervical / Trunk Assessment Cervical / Trunk Assessment: Kyphotic  Communication      Cognition Arousal/Alertness: Awake/alert Behavior During Therapy: Anxious Overall Cognitive Status: No family/caregiver present to determine baseline cognitive functioning                                        General Comments      Exercises     Assessment/Plan    PT Assessment Patient needs continued PT services  PT Problem List Decreased strength;Decreased balance;Decreased mobility;Decreased knowledge of use of DME;Decreased coordination       PT Treatment Interventions DME instruction;Gait training;Functional mobility training;Therapeutic activities;Balance training;Patient/family education;Therapeutic  exercise    PT Goals (Current goals can be found in the Care Plan section)  Acute Rehab PT Goals Patient Stated Goal: none stated PT Goal Formulation: Patient unable to participate in goal setting Time For Goal Achievement: 07/06/18 Potential to Achieve Goals: Fair    Frequency Min 2X/week   Barriers to discharge        Co-evaluation               AM-PAC PT "6 Clicks" Mobility  Outcome Measure Help needed turning from your back to your side while in a flat bed without using bedrails?: A Little Help needed moving from lying on your back to sitting on the side of a flat bed without using  bedrails?: A Little Help needed moving to and from a bed to a chair (including a wheelchair)?: A Lot Help needed standing up from a chair using your arms (e.g., wheelchair or bedside chair)?: A Lot Help needed to walk in hospital room?: A Lot Help needed climbing 3-5 steps with a railing? : Total 6 Click Score: 13    End of Session Equipment Utilized During Treatment: Gait belt Activity Tolerance: Patient tolerated treatment well Patient left: in bed;with call bell/phone within reach;with bed alarm set   PT Visit Diagnosis: Unsteadiness on feet (R26.81);Difficulty in walking, not elsewhere classified (R26.2);Ataxic gait (R26.0);History of falling (Z91.81);Muscle weakness (generalized) (M62.81)    Time: 1610-9604 PT Time Calculation (min) (ACUTE ONLY): 20 min   Charges:   PT Evaluation $PT Eval Moderate Complexity: 1 Mod            Rebeca Alert, PT Acute Rehabilitation Services Pager: [phone] Office: [phone]

## 2018-06-22 NOTE — [place] FL2 (Signed)
[place] MEDICAID FL2 LEVEL OF CARE SCREENING TOOL     IDENTIFICATION  Patient Name: Katrina Meadows Birthdate: 1928/06/29 Sex: female Admission Date (Current Location): 06/20/2018  [facility] and [place] Number:  Producer, television/film/video and Address:  [facility],  [address] [place] [address]      Provider Number: [id number]  Attending Physician Name and Address:  Delaine Lame, MD  Relative Name and Phone Number:       Current Level of Care: Hospital Recommended Level of Care: Skilled Nursing Facility Prior Approval Number:    Date Approved/Denied:   PASRR Number:    Discharge Plan: SNF    Current Diagnoses: Patient Active Problem List   Diagnosis Date Noted  . Symptomatic anemia 06/20/2018  . Insomnia 02/13/2018  . Vascular dementia without behavioral disturbance (HCC) 02/13/2018  . Chronic generalized pain 10/29/2017  . Cachexia (HCC) 10/18/2017  . Severe protein-calorie malnutrition (HCC) 10/18/2017  . Unsteady gait 10/18/2017  . Frequent falls 10/18/2017  . Aortic mural thrombus (HCC) 12/20/2013  . Essential hypertension 05/10/2013  . Mixed hyperlipidemia 05/10/2013  . Hypothyroidism 05/10/2013  . Anemia of chronic disease 05/10/2013    Orientation RESPIRATION BLADDER Height & Weight     Self  Normal Continent Weight: 86 lb 13.8 oz (39.4 kg) Height:  5' (152.4 cm)  BEHAVIORAL SYMPTOMS/MOOD NEUROLOGICAL BOWEL NUTRITION STATUS      Continent Diet(low sodium heart healthy diet)  AMBULATORY STATUS COMMUNICATION OF NEEDS Skin   Extensive Assist Verbally Normal                       Personal Care Assistance Level of Assistance  Bathing, Feeding, Dressing Bathing Assistance: Maximum assistance Feeding assistance: Limited assistance Dressing Assistance: Limited assistance     Functional Limitations Info  Sight, Hearing, Speech Sight Info: Adequate Hearing Info: Adequate Speech Info: Adequate    SPECIAL CARE FACTORS FREQUENCY                        Contractures Contractures Info: Not present    Additional Factors Info  Code Status, Allergies Code Status Info: DNR Allergies Info: Ace Inhibitors, Chocolate, Clindamycin/lincomycin, Codeine, Copper-containing Compounds, Lotrel Amlodipine Besy-benazepril Hcl, Nickel, Sulfa Antibiotics           Current Medications (06/22/2018):  This is the current hospital active medication list Current Facility-Administered Medications  Medication Dose Route Frequency Provider Last Rate Last Dose  . acetaminophen (TYLENOL) tablet 650 mg  650 mg Oral Q6H PRN Eduard Clos, MD       Or  . acetaminophen (TYLENOL) suppository 650 mg  650 mg Rectal Q6H PRN Eduard Clos, MD      . amLODipine (NORVASC) tablet 10 mg  10 mg Oral Daily Eduard Clos, MD   10 mg at 06/22/18 1103  . divalproex (DEPAKOTE) DR tablet 125 mg  125 mg Oral TID Eduard Clos, MD   125 mg at 06/22/18 1103  . docusate sodium (COLACE) capsule 100 mg  100 mg Oral Daily Eduard Clos, MD   100 mg at 06/22/18 1103  . enoxaparin (LOVENOX) injection 30 mg  30 mg Subcutaneous QHS Danford Bad, RPH   30 mg at 06/21/18 2143  . ferrous sulfate tablet 325 mg  325 mg Oral Daily Eduard Clos, MD   325 mg at 06/22/18 1103  . levothyroxine (SYNTHROID, LEVOTHROID) tablet 50 mcg  50 mcg Oral Q0600  Eduard ClosKakrakandy, Arshad N, MD   50 mcg at 06/22/18 [id number]  . ondansetron (ZOFRAN) tablet 4 mg  4 mg Oral Q6H PRN Eduard ClosKakrakandy, Arshad N, MD       Or  . ondansetron [facility](ZOFRAN) injection 4 mg  4 mg Intravenous Q6H PRN Eduard ClosKakrakandy, Arshad N, MD      . traZODone (DESYREL) tablet 25 mg  25 mg Oral QHS Eduard ClosKakrakandy, Arshad N, MD   25 mg at 06/21/18 2142     Discharge Medications: Please see discharge summary for a list of discharge medications.  Relevant Imaging Results:  Relevant Lab Results:   Additional Information SS#[id number]  Nelwyn SalisburyMeghan R Talyn Eddie, LCSW

## 2019-02-07 DEATH — deceased
